# Patient Record
Sex: Female | Born: 1950
Health system: Southern US, Community
[De-identification: ages and names within clinical notes are randomized; demographics above are authoritative.]

## PROBLEM LIST (undated history)

## (undated) DIAGNOSIS — I10 Essential (primary) hypertension: Secondary | ICD-10-CM

## (undated) DIAGNOSIS — M1990 Unspecified osteoarthritis, unspecified site: Secondary | ICD-10-CM

## (undated) DIAGNOSIS — G459 Transient cerebral ischemic attack, unspecified: Secondary | ICD-10-CM

## (undated) DIAGNOSIS — E785 Hyperlipidemia, unspecified: Secondary | ICD-10-CM

## (undated) DIAGNOSIS — M65839 Other synovitis and tenosynovitis, unspecified forearm: Secondary | ICD-10-CM

## (undated) DIAGNOSIS — Z86718 Personal history of other venous thrombosis and embolism: Secondary | ICD-10-CM

## (undated) DIAGNOSIS — K219 Gastro-esophageal reflux disease without esophagitis: Secondary | ICD-10-CM

## (undated) DIAGNOSIS — R2230 Localized swelling, mass and lump, unspecified upper limb: Secondary | ICD-10-CM

## (undated) DIAGNOSIS — F419 Anxiety disorder, unspecified: Secondary | ICD-10-CM

## (undated) HISTORY — PX: KNEE ARTHROSCOPY: SUR90

## (undated) HISTORY — PX: ABDOMINAL HYSTERECTOMY: SHX81

## (undated) HISTORY — PX: FOOT ARTHRODESIS, TRIPLE: SUR54

## (undated) HISTORY — PX: TENDON RECONSTRUCTION: SHX2487

## (undated) HISTORY — PX: FOOT SURGERY: SHX648

## (undated) HISTORY — DX: Hyperlipidemia, unspecified: E78.5

## (undated) HISTORY — PX: CHOLECYSTECTOMY: SHX55

---

## 1997-09-21 ENCOUNTER — Encounter: Admission: RE | Admit: 1997-09-21 | Discharge: 1997-12-20 | Payer: Self-pay | Admitting: Orthopedic Surgery

## 1997-12-28 ENCOUNTER — Ambulatory Visit (HOSPITAL_COMMUNITY): Admission: RE | Admit: 1997-12-28 | Discharge: 1997-12-28 | Payer: Self-pay | Admitting: Urology

## 1998-05-04 ENCOUNTER — Ambulatory Visit (HOSPITAL_COMMUNITY): Admission: RE | Admit: 1998-05-04 | Discharge: 1998-05-04 | Payer: Self-pay | Admitting: Orthopedic Surgery

## 1998-07-19 ENCOUNTER — Ambulatory Visit (HOSPITAL_BASED_OUTPATIENT_CLINIC_OR_DEPARTMENT_OTHER): Admission: RE | Admit: 1998-07-19 | Discharge: 1998-07-19 | Payer: Self-pay | Admitting: Orthopedic Surgery

## 1998-07-26 ENCOUNTER — Ambulatory Visit (HOSPITAL_COMMUNITY): Admission: RE | Admit: 1998-07-26 | Discharge: 1998-07-26 | Payer: Self-pay | Admitting: Orthopedic Surgery

## 1999-01-17 ENCOUNTER — Ambulatory Visit (HOSPITAL_BASED_OUTPATIENT_CLINIC_OR_DEPARTMENT_OTHER): Admission: RE | Admit: 1999-01-17 | Discharge: 1999-01-17 | Payer: Self-pay | Admitting: Orthopedic Surgery

## 1999-06-06 ENCOUNTER — Ambulatory Visit (HOSPITAL_BASED_OUTPATIENT_CLINIC_OR_DEPARTMENT_OTHER): Admission: RE | Admit: 1999-06-06 | Discharge: 1999-06-06 | Payer: Self-pay | Admitting: Orthopedic Surgery

## 1999-09-04 ENCOUNTER — Encounter: Payer: Self-pay | Admitting: Orthopedic Surgery

## 1999-09-09 ENCOUNTER — Encounter: Payer: Self-pay | Admitting: Orthopedic Surgery

## 1999-09-09 ENCOUNTER — Inpatient Hospital Stay (HOSPITAL_COMMUNITY): Admission: RE | Admit: 1999-09-09 | Discharge: 1999-09-13 | Payer: Self-pay | Admitting: Orthopedic Surgery

## 1999-09-09 HISTORY — PX: TOTAL KNEE ARTHROPLASTY: SHX125

## 1999-09-12 ENCOUNTER — Encounter: Payer: Self-pay | Admitting: Orthopedic Surgery

## 1999-12-26 ENCOUNTER — Other Ambulatory Visit: Admission: RE | Admit: 1999-12-26 | Discharge: 1999-12-26 | Payer: Self-pay | Admitting: Gynecology

## 2000-01-27 ENCOUNTER — Ambulatory Visit (HOSPITAL_BASED_OUTPATIENT_CLINIC_OR_DEPARTMENT_OTHER): Admission: RE | Admit: 2000-01-27 | Discharge: 2000-01-27 | Payer: Self-pay | Admitting: Orthopedic Surgery

## 2000-01-27 HISTORY — PX: EXAMINATION UNDER ANESTHESIA: SHX1540

## 2000-01-27 HISTORY — PX: CLOSED MANIPULATION KNEE WITH STERIOD INJECTION: SHX5610

## 2000-04-07 ENCOUNTER — Inpatient Hospital Stay (HOSPITAL_COMMUNITY): Admission: RE | Admit: 2000-04-07 | Discharge: 2000-04-08 | Payer: Self-pay | Admitting: Urology

## 2000-04-07 HISTORY — PX: ANTERIOR AND POSTERIOR VAGINAL REPAIR: SUR5

## 2000-04-07 HISTORY — PX: PUBOVAGINAL SLING: SHX1035

## 2000-10-20 ENCOUNTER — Inpatient Hospital Stay (HOSPITAL_COMMUNITY): Admission: RE | Admit: 2000-10-20 | Discharge: 2000-10-21 | Payer: Self-pay | Admitting: Neurosurgery

## 2000-10-20 ENCOUNTER — Encounter: Payer: Self-pay | Admitting: Neurosurgery

## 2000-10-20 HISTORY — PX: ANTERIOR CERVICAL DECOMP/DISCECTOMY FUSION: SHX1161

## 2000-11-06 ENCOUNTER — Encounter: Payer: Self-pay | Admitting: Neurosurgery

## 2000-11-06 ENCOUNTER — Encounter: Admission: RE | Admit: 2000-11-06 | Discharge: 2000-11-06 | Payer: Self-pay | Admitting: Neurosurgery

## 2000-12-07 ENCOUNTER — Encounter: Payer: Self-pay | Admitting: Neurosurgery

## 2000-12-07 ENCOUNTER — Encounter: Admission: RE | Admit: 2000-12-07 | Discharge: 2000-12-07 | Payer: Self-pay | Admitting: Neurosurgery

## 2000-12-19 ENCOUNTER — Encounter: Payer: Self-pay | Admitting: Neurosurgery

## 2000-12-19 ENCOUNTER — Ambulatory Visit (HOSPITAL_COMMUNITY): Admission: RE | Admit: 2000-12-19 | Discharge: 2000-12-19 | Payer: Self-pay | Admitting: Neurosurgery

## 2001-01-29 ENCOUNTER — Other Ambulatory Visit: Admission: RE | Admit: 2001-01-29 | Discharge: 2001-01-29 | Payer: Self-pay | Admitting: Obstetrics and Gynecology

## 2002-02-10 ENCOUNTER — Ambulatory Visit (HOSPITAL_BASED_OUTPATIENT_CLINIC_OR_DEPARTMENT_OTHER): Admission: RE | Admit: 2002-02-10 | Discharge: 2002-02-10 | Payer: Self-pay | Admitting: Orthopedic Surgery

## 2002-02-10 HISTORY — PX: FOOT GANGLION EXCISION: SHX1660

## 2002-02-25 ENCOUNTER — Other Ambulatory Visit: Admission: RE | Admit: 2002-02-25 | Discharge: 2002-02-25 | Payer: Self-pay | Admitting: Obstetrics and Gynecology

## 2002-09-07 ENCOUNTER — Encounter: Payer: Self-pay | Admitting: Obstetrics and Gynecology

## 2002-09-07 ENCOUNTER — Encounter: Admission: RE | Admit: 2002-09-07 | Discharge: 2002-09-07 | Payer: Self-pay | Admitting: Obstetrics and Gynecology

## 2003-04-12 ENCOUNTER — Ambulatory Visit (HOSPITAL_BASED_OUTPATIENT_CLINIC_OR_DEPARTMENT_OTHER): Admission: RE | Admit: 2003-04-12 | Discharge: 2003-04-12 | Payer: Self-pay | Admitting: Orthopedic Surgery

## 2003-04-12 ENCOUNTER — Ambulatory Visit (HOSPITAL_COMMUNITY): Admission: RE | Admit: 2003-04-12 | Discharge: 2003-04-12 | Payer: Self-pay | Admitting: Orthopedic Surgery

## 2003-04-12 HISTORY — PX: CARPAL TUNNEL RELEASE: SHX101

## 2003-05-25 ENCOUNTER — Ambulatory Visit (HOSPITAL_BASED_OUTPATIENT_CLINIC_OR_DEPARTMENT_OTHER): Admission: RE | Admit: 2003-05-25 | Discharge: 2003-05-25 | Payer: Self-pay | Admitting: Orthopedic Surgery

## 2003-05-25 HISTORY — PX: CARPAL TUNNEL RELEASE: SHX101

## 2003-06-19 ENCOUNTER — Ambulatory Visit (HOSPITAL_COMMUNITY): Admission: RE | Admit: 2003-06-19 | Discharge: 2003-06-19 | Payer: Self-pay | Admitting: Orthopedic Surgery

## 2003-09-07 ENCOUNTER — Ambulatory Visit (HOSPITAL_COMMUNITY): Admission: RE | Admit: 2003-09-07 | Discharge: 2003-09-07 | Payer: Self-pay | Admitting: Orthopedic Surgery

## 2003-09-12 ENCOUNTER — Ambulatory Visit (HOSPITAL_COMMUNITY): Admission: RE | Admit: 2003-09-12 | Discharge: 2003-09-12 | Payer: Self-pay | Admitting: Orthopedic Surgery

## 2005-09-04 ENCOUNTER — Other Ambulatory Visit: Admission: RE | Admit: 2005-09-04 | Discharge: 2005-09-04 | Payer: Self-pay | Admitting: Obstetrics and Gynecology

## 2008-10-22 ENCOUNTER — Encounter: Admission: RE | Admit: 2008-10-22 | Discharge: 2008-10-22 | Payer: Self-pay | Admitting: Sports Medicine

## 2008-10-30 ENCOUNTER — Encounter: Admission: RE | Admit: 2008-10-30 | Discharge: 2008-10-30 | Payer: Self-pay | Admitting: Sports Medicine

## 2008-11-13 ENCOUNTER — Encounter: Admission: RE | Admit: 2008-11-13 | Discharge: 2008-11-13 | Payer: Self-pay | Admitting: Sports Medicine

## 2008-11-30 ENCOUNTER — Encounter: Admission: RE | Admit: 2008-11-30 | Discharge: 2008-11-30 | Payer: Self-pay | Admitting: Sports Medicine

## 2009-06-24 ENCOUNTER — Encounter: Admission: RE | Admit: 2009-06-24 | Discharge: 2009-06-24 | Payer: Self-pay | Admitting: Neurosurgery

## 2010-03-23 DIAGNOSIS — G459 Transient cerebral ischemic attack, unspecified: Secondary | ICD-10-CM | POA: Insufficient documentation

## 2010-03-23 HISTORY — DX: Transient cerebral ischemic attack, unspecified: G45.9

## 2010-03-31 ENCOUNTER — Encounter: Payer: Self-pay | Admitting: Emergency Medicine

## 2010-04-01 ENCOUNTER — Inpatient Hospital Stay (HOSPITAL_COMMUNITY): Admission: EM | Admit: 2010-04-01 | Discharge: 2010-04-02 | Payer: Self-pay | Admitting: Internal Medicine

## 2010-04-02 ENCOUNTER — Encounter (INDEPENDENT_AMBULATORY_CARE_PROVIDER_SITE_OTHER): Payer: Self-pay | Admitting: Internal Medicine

## 2010-09-04 LAB — DIFFERENTIAL
Basophils Absolute: 0 10*3/uL (ref 0.0–0.1)
Basophils Relative: 0 % (ref 0–1)
Eosinophils Absolute: 0.1 10*3/uL (ref 0.0–0.7)
Eosinophils Relative: 2 % (ref 0–5)
Lymphocytes Relative: 24 % (ref 12–46)
Lymphs Abs: 2.2 10*3/uL (ref 0.7–4.0)
Monocytes Absolute: 0.6 10*3/uL (ref 0.1–1.0)
Monocytes Relative: 6 % (ref 3–12)
Neutro Abs: 6.3 10*3/uL (ref 1.7–7.7)
Neutrophils Relative %: 69 % (ref 43–77)

## 2010-09-04 LAB — CBC
HCT: 40.1 % (ref 36.0–46.0)
Hemoglobin: 13.3 g/dL (ref 12.0–15.0)
MCH: 27.9 pg (ref 26.0–34.0)
MCHC: 33.3 g/dL (ref 30.0–36.0)
MCV: 83.8 fL (ref 78.0–100.0)
Platelets: 242 10*3/uL (ref 150–400)
RBC: 4.78 MIL/uL (ref 3.87–5.11)
RDW: 12.9 % (ref 11.5–15.5)
WBC: 9.1 10*3/uL (ref 4.0–10.5)

## 2010-09-04 LAB — POCT I-STAT, CHEM 8
BUN: 20 mg/dL (ref 6–23)
Calcium, Ion: 1.1 mmol/L — ABNORMAL LOW (ref 1.12–1.32)
Chloride: 103 mEq/L (ref 96–112)
Creatinine, Ser: 1 mg/dL (ref 0.4–1.2)
Glucose, Bld: 98 mg/dL (ref 70–99)
HCT: 42 % (ref 36.0–46.0)
Hemoglobin: 14.3 g/dL (ref 12.0–15.0)
Potassium: 3.7 mEq/L (ref 3.5–5.1)
Sodium: 140 mEq/L (ref 135–145)
TCO2: 30 mmol/L (ref 0–100)

## 2010-09-04 LAB — URINALYSIS, ROUTINE W REFLEX MICROSCOPIC
Bilirubin Urine: NEGATIVE
Glucose, UA: NEGATIVE mg/dL
Hgb urine dipstick: NEGATIVE
Ketones, ur: NEGATIVE mg/dL
Nitrite: NEGATIVE
Protein, ur: NEGATIVE mg/dL
Specific Gravity, Urine: 1.019 (ref 1.005–1.030)
Urobilinogen, UA: 0.2 mg/dL (ref 0.0–1.0)
pH: 6.5 (ref 5.0–8.0)

## 2010-09-04 LAB — APTT: aPTT: 29 seconds (ref 24–37)

## 2010-09-04 LAB — PROTIME-INR
INR: 0.98 (ref 0.00–1.49)
Prothrombin Time: 13.2 seconds (ref 11.6–15.2)

## 2010-09-05 LAB — LIPID PANEL
Cholesterol: 151 mg/dL (ref 0–200)
HDL: 40 mg/dL (ref >39)
LDL Cholesterol: 81 mg/dL (ref 0–99)
Total CHOL/HDL Ratio: 3.8 RATIO
Triglycerides: 152 mg/dL — ABNORMAL HIGH (ref <150)
VLDL: 30 mg/dL (ref 0–40)

## 2010-09-05 LAB — HEMOGLOBIN A1C
Hgb A1c MFr Bld: 6.7 % — ABNORMAL HIGH (ref <5.7)
Mean Plasma Glucose: 146 mg/dL — ABNORMAL HIGH (ref <117)

## 2010-09-05 LAB — COMPREHENSIVE METABOLIC PANEL
ALT: 22 U/L (ref 0–35)
AST: 23 U/L (ref 0–37)
Albumin: 3.8 g/dL (ref 3.5–5.2)
Alkaline Phosphatase: 83 U/L (ref 39–117)
BUN: 13 mg/dL (ref 6–23)
CO2: 27 mEq/L (ref 19–32)
Calcium: 9 mg/dL (ref 8.4–10.5)
Chloride: 106 mEq/L (ref 96–112)
Creatinine, Ser: 0.96 mg/dL (ref 0.4–1.2)
GFR calc Af Amer: >60 mL/min (ref >60)
GFR calc non Af Amer: 59 mL/min — ABNORMAL LOW (ref >60)
Glucose, Bld: 102 mg/dL — ABNORMAL HIGH (ref 70–99)
Potassium: 3.8 mEq/L (ref 3.5–5.1)
Sodium: 141 mEq/L (ref 135–145)
Total Bilirubin: 0.5 mg/dL (ref 0.3–1.2)
Total Protein: 6.9 g/dL (ref 6.0–8.3)

## 2010-09-05 LAB — CARDIAC PANEL(CRET KIN+CKTOT+MB+TROPI)
CK, MB: 1.6 ng/mL (ref 0.3–4.0)
CK, MB: 1.8 ng/mL (ref 0.3–4.0)
CK, MB: 2 ng/mL (ref 0.3–4.0)
Relative Index: 1.4 (ref 0.0–2.5)
Relative Index: 1.7 (ref 0.0–2.5)
Relative Index: 1.9 (ref 0.0–2.5)
Total CK: 107 U/L (ref 7–177)
Total CK: 109 U/L (ref 7–177)
Total CK: 113 U/L (ref 7–177)
Troponin I: 0.01 ng/mL (ref 0.00–0.06)
Troponin I: 0.01 ng/mL (ref 0.00–0.06)
Troponin I: <0.01 ng/mL (ref 0.00–0.06)

## 2010-09-05 LAB — CBC
HCT: 40.6 % (ref 36.0–46.0)
Hemoglobin: 13.3 g/dL (ref 12.0–15.0)
MCH: 27.3 pg (ref 26.0–34.0)
MCHC: 32.8 g/dL (ref 30.0–36.0)
MCV: 83.4 fL (ref 78.0–100.0)
Platelets: 198 10*3/uL (ref 150–400)
RBC: 4.87 MIL/uL (ref 3.87–5.11)
RDW: 13.2 % (ref 11.5–15.5)
WBC: 7.3 10*3/uL (ref 4.0–10.5)

## 2010-09-05 LAB — DIFFERENTIAL
Basophils Absolute: 0 10*3/uL (ref 0.0–0.1)
Basophils Relative: 0 % (ref 0–1)
Eosinophils Absolute: 0.1 10*3/uL (ref 0.0–0.7)
Eosinophils Relative: 2 % (ref 0–5)
Lymphocytes Relative: 24 % (ref 12–46)
Lymphs Abs: 1.7 10*3/uL (ref 0.7–4.0)
Monocytes Absolute: 0.7 10*3/uL (ref 0.1–1.0)
Monocytes Relative: 9 % (ref 3–12)
Neutro Abs: 4.8 10*3/uL (ref 1.7–7.7)
Neutrophils Relative %: 65 % (ref 43–77)

## 2010-11-08 NOTE — Discharge Summary (Signed)
[place]. [facility]  Patient:    Tonya Sanchez, Tonya Sanchez                         MRN: [id number] Adm. Date:  09/09/99 Disc. Date: 09/13/99 Attending:  Loreta Ave, M.D.                           Discharge Summary  ADMISSION DIAGNOSIS:  Advanced degenerative joint disease upper left knee.  DISCHARGE DIAGNOSIS:  Advanced degenerative joint disease upper left knee.  PROCEDURE:  Left total knee replacement.  HISTORY OF PRESENT ILLNESS:  This patient has been having longstanding difficulty with her left knee.  She had been previously arthroscoped noting grade 4 DJD medially.  She has continued with chronic persistent left medial joint pain and has failed conservative treatment.  She is now indicated for left unicompartmental versus total knee replacement.  HOSPITAL COURSE:  A 60 year old white female admitted to the hospital on 09/09/99 after appropriate laboratory studies were obtained as well as 1 g of vancomycin IV on call.  She was taken to the operating room where she underwent a left total knee replacement.  At the time of surgery, it was noted that she had also changes on the lateral compartment and it was felt that unicompartmental knee was not indicated.  Therefore, a full total knee replacement was performed.  She tolerated the procedure well.  Epidural was placed postoperatively for pain control.  Heparin 5000 units subcu q.12h. was begun until Coumadin became therapeutic.  PT, OT, and rehab consults were ordered.  Physical therapy for weightbearing as tolerated on the left with a walker.  CPM 0 to 30 degrees six to eight hours per day incrementing by 10 degrees per day.  She did not tolerate her Percocet which caused itching and therefore was changed to Vicodin or Lortab 7.5/500 one p.o. q.4h. p.r.n. pain.  Her Foley was discontinued on 3/21 as well as having a dressing change. Epidural was also discontinued on 3/21.  She did develop a temperature  and this was evaluated with CBC and differential, chest x-ray, UA, C&S as well as Doppler to rule out DVT.  Left femur and hip were also x-rayed because of proximal thigh pain.  These were all essentially negative.  Her temperature improved and it was felt that she was ambulatory and stable and was discharged on 3/23 in improved condition.  She will return in follow up back to the office in 7 to 10 days for staple removal.  DOPPLER STUDIES:  No evidence of DVT.  RADIOGRAPHIC STUDIES:  On 09/12/99, chest x-ray showed no active lung disease, left total knee replacement in good position, negative left hip.  Of 09/09/99, left knee revealed anatomic alignment postop total knee replacement.  LABORATORY STUDIES:  On 09/04/99, hemoglobin 13.1, hematocrit 38.5%, white count 8100, platelets 225,000.  On 09/12/99, hemoglobin 9.6, hematocrit 28.0%, white count 8600, platelets 177,000.  Chemistries of 3/14: Sodium 139, potassium 4.1, chloride 101, CO2 of 31, glucose of 105, BUN 12, creatinine 0.8, calcium 9.3.  Total protein 7.1, albumin 3.9, AST 22, ALT 17, ALP 81, total bilirubin 0.6.  Discharge of 3/22: Sodium 135, potassium 3.6, chloride 99, CO2 of 27, glucose 137, BUN 6, creatinine 0.7, calcium 8.4.  Urinalysis of 3/14 and 3/22 were benign.  Blood type was A positive, antibody screen negative.  Urine culture of 3/22 showed 4000 colonies suggests that this is  multiple species and recollection is necessary if needing diagnostic bacteria.  DISCHARGE INSTRUCTIONS:  Given a prescription for iron sulfate 324 mg one p.o. daily with meals.  Coumadin 5 mg as directed by pharmacy.  Continue home meds. Weightbear as tolerated using the walker and immobilizer.  She may shower.  No tub baths.  Keep the wound clean and dry.  Use the CPM machine as instructed. Follow back up in the office in 10 days for follow up. DD:  10/08/99 TD:  10/08/99 Job: [id number]

## 2010-11-08 NOTE — Op Note (Signed)
[place]. [facility]  Patient:    Tonya Sanchez, Tonya Sanchez                       MRN: [id number] Proc. Date: 10/20/00 Adm. Date:  [id number] Attending:  Josie Saunders                           Operative Report  PREOPERATIVE DIAGNOSIS:  Herniated cervical disc with cervical spondylosis, degenerative disc disease and cervical radiculopathy.  POSTOPERATIVE DIAGNOSIS:  Herniated cervical disc with cervical spondylosis, degenerative disc disease and cervical radiculopathy.  OPERATION:  Anterior cervical diskectomy and fusion C5-6 with Allograft bone graft and anterior cervical plate.  SURGEON:  Danae Orleans. Venetia Maxon, M.D.  ASSISTANT:  Hewitt Shorts, M.D.  ANESTHESIA:  General endotracheal  ESTIMATED BLOOD LOSS:  Minimal  COMPLICATIONS:  None.  DISPOSITION:  Recovery.  INDICATIONS: [name] is a 60 year old disabled woman with significant right greater than left upper extremity pain and weakness in a C6 distribution. She has marked spondylitic disease at the C5-6 level.  It was elected to take her to surgery for anterior cervical diskectomy and fusion.  DESCRIPTION OF PROCEDURE:  [name] was brought to the operating room. Following satisfactory and uncomplicated induction of general endotracheal anesthesia and placement of intravenous line, she was placed in the supine position on the operating table.  Her neck was placed in slight extension. She was placed in 10 pounds of Holter traction.  Her anterior neck was then prepped and draped in the usual sterile fashion.  The planned incision was infiltrated with 0.25% Marcaine and 0.5% Lidocaine with 1:200,000 epinephrine. Incision was made from the midline to the anterior border of the sternocleidomastoid muscle on the left side of her neck.  This was carried sharply through platysmal layer and subplatysmal dissection was performed exposing the anterior border of the sternocleidomastoid muscle.   Blunt dissection was performed keeping the carotid sheath lateral, trachea and esophagus medial, twisting the anterior cervical spine.  A fine needle was placed relative to the C5-6 level and intraoperative x-ray confirmed this to be the C5-6 level.  Subsequently the longus colli muscles were taken down from the anterior cervical spine bilaterally from C5 to C6 with electrocautery and Key elevator.  Self retaining retractor was placed to facilitate exposure of the disc space.  The C5-6 level was then incised with #15 blade.  Disc material was removed in a piece meal fashion.  The end plates of the degenerated disc were then scraped of residual disc material.  Disc space spreader was placed.  The microscope was brought into the field.  Under microscopic visualization using the Macro Max drill and O8M bur the end plates of C5 and C6 were decorticated and the large uncinate and end plate spurs of C5 and C6 were drilled down and then removed with 2 mm gold tip Kerrison rongeur.  The posterior longitudinal ligament was incised with arachnoid knife and removed in a piece meal fashion.  Both of the take offs of the C5-6 and C6 nerve roots were identified and decompressed as they extended out the neuroforamina.  Particular care was paid to the right side and the nerve root was widely decompressed.  Hemostasis was assured with Gelfoam soaked in thrombin.  An 8 mm Allograft tricortical bone graft was then cut to the appropriate depth inserted in the interspace and counter sunk appropriately. The ventral spine was then prepared  for plating and a 16 mm Tether anterior cervical plate was affixed to the anterior cervical spine with two 13 x 4 mm variable angle screws at C5 and two similarly sized screws at C6. All screws had excellent purchase.  Locking mechanisms were engaged.  Wound was copiously irrigated with Bacitracin and saline.  Final x-ray was obtained to confirm position of bone graft and the  anterior cervical plate.  The wound was then irrigated. Hemostasis was assured.  Platysmal layer was closed with interrupted 3-0 Vicryl stitch and the skin edges were reapproximated with running 4-0 Vicryl subcuticular stitch.  The wound was dressed with Benzoin and Steri-Strips, Telfa gauze and tape.  The patient was extubated in the operating room and taken recovery room in stable and satisfactory condition having tolerated the operation well. Counts were correct at the end of the case. DD:  10/20/00 TD:  10/20/00 Job: [id number]

## 2010-11-08 NOTE — Op Note (Signed)
[place]. [facility]  Patient:    Tonya Sanchez, Tonya Sanchez                       MRN: [id number] Proc. Date: 09/09/99 Adm. Date:  [id number] Attending:  Colbert Ewing                           Operative Report  PREOPERATIVE DIAGNOSIS:  End-stage degenerative joint disease left knee, primarily medial compartment.  POSTOPERATIVE DIAGNOSIS:  End-stage degenerative joint disease left knee, primarily medial compartment with involvement of all three compartments.  PROCEDURE:  Left total knee replacement utilizing Osteonics prosthesis. Press-fit #7 femoral component, cemented #7 tibial component with 10 mm polyethylene insert, cruciate-retaining type, cemented/recessed, nonmetal-backed 26 mm patella component.  Lateral retinacular release.  SURGEON:  Loreta Ave, M.D.  ASSISTANT:  Arlys John D. Petrarca, P.A.-C.  ANESTHESIA:  General.  BLOOD LOSS:  Minimal.  TOURNIQUET TIME:  One hour and 10 minutes.  SPECIMENS:  Excised bone and soft tissue.  CULTURES:  None.  COMPLICATIONS:  None.  DRESSING:  Soft compressive with knee immobilizer.  DRAINS:  Hemovac x 2.  DESCRIPTION OF PROCEDURE:  Patient brought to the operating room and after adequate anesthesia had been obtained, left leg was prepped and draped in usual sterile fashion after a tourniquet was applied at the upper aspect of the thigh. Exsanguinated with elevation and Esmarch.  Tourniquet inflated to 350 mmHg. Knee examined with slight flexion contracture, mild varus alignment, stable ligaments, flexion to 100 degrees.  Straight incision above the patella down to the tibial  tubercle.  Started out relatively small in hopes of doing a unicompartmental replacement.  Medial parapatellar arthrotomy.  Knee was opened and inspected with grade 4 changes medially, but also areas of grade 4 changes laterally and lesser extent patellofemoral joint.  Given the changes in the lateral  compartment, unicompartmental replacement not deemed viable.  Incision opened sufficiently to allow access for knee replacement utilizing complete knee replacement.  The patella was everted, the knee was exposed.  Remnants of menisci and anterior cruciate ligament and excessive fat pad removed.  Distal femur exposed and spurs removed. Intramedullary guide placed.  Distal cut set at 5 degrees of valgus removing 10 mm.  Sized to a #7 component.  Jigs put in place, definitive cuts made for cruciate-retaining prosthesis.  Trial put in place, found to fit well.  Trial removed.  Posterior cruciate ligament and popliteal tendon retained throughout.  Proximal end exposed.  Tibial spines resected with a saw.  Intramedullary guide  placed.  Proximal cut removing 4 mm from the most deficient medial side.  Five degree posterior slope cut.  Trials put in place with a #7 component on the tibia, which fit well, as well as, a #7 component on the femur.  With a 10 mm insert I had full extension, full flexion, nicely balanced knee set at 5 degrees of valgus.  Tibia was marked for rotation.  This was then hand reamed with the appropriate and reamers for a #7 component.  Trials removed.  The patella was then incised, reamed and drilled for a 26 mm patella.  All trials put in place.  Persistent lateral tracking patellofemoral joint.  Lateral release therefore performed with electrocautery from inside out yielding good tracking.  All trials removed. Copious irrigation with the pulse irrigating device.  Cement prepared, placed on the tibial and patella components, which were seated  appropriately with excessive cement removed.  Polyethylene attached to the tibia.  Femoral component hammered in place.  Once the cement had dried, the knee was examined.  Full extension, full  flexion, 5 degrees of valgus alignment.  Good patellofemoral tracking, nicely balanced knee.  Wound irrigated.  Hemovacs were  placed and brought out through separate stab wounds.  Arthrotomy closed with #1 Vicryl.  Skin and subcutaneous  tissue with Vicryl and staples.  Margins of the wound and the knee injected with Marcaine.  Sterile compressive dressing applied. Dressings taken and placed over the knee after drapes were taken down.  Knee immobilizer applied after tourniquet removed.  Anesthesia was then to proceed with placement of an epidural catheter  for postoperative analgesia.  Once completed, anesthesia reversed, brought to recovery room.  Tolerated surgery well, no complications. DD:  09/11/99 TD:  09/11/99 Job: [id number]

## 2010-11-08 NOTE — H&P (Signed)
[facility]  Patient:    Tonya Sanchez, Tonya Sanchez                       MRN: [id number] Adm. Date:  [id number] Disc. Date: [id number] Attending:  Colbert Ewing                         History and Physical  CHIEF COMPLAINT:  Recurrent urinary incontinence and pelvic prolapse.  HISTORY OF PRESENT ILLNESS:  The patient is a 60 year old gravida 5, para 4-0-1-4 Caucasian female, was placed total abdominal hysterectomy for uterine fibroids in 1975 and status post Stamey procedure with anterior colporrhaphy in 1988, who presented to the office with the complaint of recurrent urinary incontinence.  The patient reports that she had recurrence of her urinary incontinence shortly following her Stamey procedure.  The patient reported leakage of urine with coughing and sneezing along with urge incontinence which required pad use.  The patient also reported leakage of urine during sexual activity, which was very bothersome to her.  The patient denies any history of fecal incontinence, but reported the need for occasional splinting in order to have a bowel movement.  The patient requested surgical treatment for both the urinary incontinence and for the pelvic floor relaxation and difficulty with bowel movements.  The patients urologic history is significant for multiple prior cystoscopies with biopsies for benign bladder tumors.  The patient has been followed closely by her urologist, Dr. Annabell Howells.  PAST OBSTETRIC AND GYNECOLOGIC HISTORY: 1. Status post total abdominal hysterectomy for benign fibroids in 1975. 2. Status post normal spontaneous vaginal delivery x 4. 3. Pap smear on December 26, 1999 within normal limits. 4. Mammogram on January 07, 2000 demonstrated scattered benign calcifications. 5. The patient is receiving hormone replacement therapy with Climera patches    0.75 mg every week.  PAST MEDICAL HISTORY: 1. Bells palsy diagnosed in 25. 2. Arthritis. 3.  Colitis. 4. Deep venous thrombosis following cholecystectomy. 5. Disk herniation in the lower back. 6. History of blood transfusion.  PAST SURGICAL HISTORY: 1. Status post D&C in 1971. 2. Status post total abdominal hysterectomy in 1975. 3. Status post cholecystectomy in 1976. 4. Status post Stamey procedure with anterior colporrhaphy in 1988. 5. Status post multiple surgeries on bilateral hips, knees and feet. 6. Status post right thumb release. 7. Status post multiple cystoscopies with biopsies for benign bladder tumors.  MEDICATIONS:  Climera patch 0.75 mg to the skin every week.  ALLERGIES:  PERCOCET produces ITCHING.  KEFLEX results in YEAST INFECTIONS.  SOCIAL HISTORY:  The patient is married.  She denies the use of tobacco, alcohol or illicit drugs.  FAMILY HISTORY:  The patients mother developed breast cancer at the age of 19.  Grandfather with cancer of the nose, and a grandmother with colon cancer.  REVIEW OF SYSTEMS:  Please refer to the history of present illness.  PHYSICAL EXAMINATION:  VITAL SIGNS:  Blood pressure 130/70.  GENERAL:  Pleasant, middle-aged female in no acute distress.  HEENT:  Normocephalic and atraumatic.  NECK:  Negative for adenopathy or thyromegaly.  LUNGS:  Clear to auscultation bilaterally.  HEART:  Regular rate and rhythm.  No evidence of a murmur.  ABDOMEN:  Right upper quadrant oblique incision, a vertical midline incision, a transverse suprapubic incision, all without evidence of herniation.  THe abdomen is soft, nontender and without evidence of hepatosplenomegaly or organomegaly.  PELVIC:  Normal external genitalia.  The urethra  is noted to be within normal limits.  The urethra shows hypermobility and, on Q-tip test, deflects from +3 degrees to +60 degrees.  There is evidence of a second degree cystoceles, first degree apical prolapse and a second degree rectocele.  The cervix is surgically absent.  There is no evidence of any  midline or adnexal masses. Rectovaginal examination confirms the above.  EXTREMITIES:  Examination of the lower extremities is consistent with multiple surgical incisions.  ASSESSMENT:  A 60 year old gravida 5, para 4, failed 1 female, status post total abdominal hysterectomy and status post Stamey procedure and anterior colporrhaphy who has both evidence of recurrent genuine stress incontinence and recurrent pelvic organ prolapse.  The patient has decided for surgical evaluation and treatment of the above.  In addition, the patient does have a history of benign bladder tumors and has been followed closely by her urologist.  The patient does have a history of a postoperative deep venous thrombosis.  PROCEDURE:  The patient will undergo an anterior and posterior colporrhaphy with a possible iliococcygeal sagittal vault suspension on April 07, 2000 at [facility].  At the same time, the patient will undergo a pubourethral sling and cystoscopy under the direction of Dr. Annabell Howells.  The rnb have been discussed with the patient, who wishes to proceed.  The patient will continue on her Climera patch.  The patient will be treated with TED hose stockings and either venous compression stockings or subcutaneous heparin with mini dose of subcutaneous heparin b.i.d. for DVT prophylaxis. DD:  04/06/00 TD:  04/06/00 Job: [id number]

## 2010-11-08 NOTE — Procedures (Signed)
[place]. [facility]  Patient:    Tonya Sanchez, Tonya Sanchez                       MRN: [id number] Adm. Date:  [id number] Attending:  Colbert Ewing                           Procedure Report  PROCEDURE PERFORMED:  Epidural placement.  INDICATION FOR PROCEDURE:  [name] is a 60 year old white female who  presented to the operating room for a total knee arthroplasty.  The patient discussed postoperative pain control options with me and her surgeon and elected to take a postoperative epidural catheter for her pain management.  The patient understood the risks and benefits of this procedure and understood that the catheter would be placed while she was under general anesthesia following the total knee arthroplasty.  DESCRIPTION OF PROCEDURE:  Following her surgery, the patient was placed in the  right lateral decubitus position.  The lumbar spine was sterilely prepped and draped.  A 70 gauge Tuohy needle was advanced to a loss of resistance technique at the L2-L3 interspace.  The epidural needle did not aspirate blood or CSF and the catheter was inserted approximately 5 cm into the epidural space.  The needle was removed and the catheter was secured to the patients back with tape.  Catheter id not aspirate blood or CSF and a 5 cc test dose of 1% Lidocaine was administered to the patient.  The patient tolerated this well and 3 cc of Fentanyl diluted with  normal saline in 5 cc of 1/4% Marcaine were administered to the patient in an incremental fashion.  The patient was then placed in the supine position, extubated and brought to the [facility] where she was placed on a Marcaine and Fentanyl pump. The patient tolerated the procedure well. DD:  09/09/99 TD:  09/09/99 Job: [id number]

## 2010-11-08 NOTE — Op Note (Signed)
[place]. [facility]  Patient:    Tonya, Sanchez                       MRN: [id number] Proc. Date: 01/27/00 Adm. Date:  [id number] Attending:  Colbert Ewing                           Operative Report  PREOPERATIVE DIAGNOSES: 1. Arthrofibrosis, left knee status post total knee replacement. 2. Synovitis with degenerative joint disease, left ankle.  POSTOPERATIVE DIAGNOSIS: 1. Arthrofibrosis, left knee status post total knee replacement. 2. Synovitis with degenerative joint disease, left ankle.  OPERATION: 1. Left knee exam and manipulation under anesthesia to break up adhesions.    Intra-articular injection of morphine, Marcaine and Aristospan. 2. Left ankle exam under anesthesia with intra-articular injection of    Aristospan and Marcaine.  SURGEON:  Loreta Ave, M.D.  ANESTHESIA:  General.  ESTIMATED BLOOD LOSS:  None.  SPECIMENS:  None.  CULTURES:  None.  COMPLICATIONS:  None.  DRESSING:  Soft compressive.  DESCRIPTION OF PROCEDURE:  The patient was brought to the operating room and after adequate anesthesia had been obtained, attention was turned to the left ankle.  Examination with decreased motion, reactive synovitis secondary to DJD.  No instability.  Under sterile technique, the ankle was entered from an anteromedial approach and injected with Marcaine 0.25% without epinephrine and 1 cc of Aristospan.  Band-Aid applied.  Attention, turned to the left knee. Examined with 5 degree flexion contracture.  Further flexion to about 85, relatively abrupt end point.  Gently manipulated with breaking up of adhesions.  Full flexion achieved as well as full extension with improved patella femoral mobility after manipulation.  No adverse occurrence.  Under sterile technique, the knee joint itself was injected with Aristospan and 0.25% Marcaine with epinephrine as well as with 4 cc of morphine for postoperative analgesia.  Band-Aid  applied.  Anesthesia reversed.  Brought to the recovery room.  Tolerated the surgery well with no complications. DD:  01/27/00 TD:  01/27/00 Job: [id number]

## 2010-11-08 NOTE — Op Note (Signed)
NAME:  Tonya Sanchez, Tonya Sanchez                          ACCOUNT NO.:  [id number]   MEDICAL RECORD NO.:  [id number]                   PATIENT TYPE:  OUT   LOCATION:  [facility]                                 FACILITY:  [facility]   PHYSICIAN:  Loreta Ave, M.D.              DATE OF BIRTH:  06-12-1951   DATE OF PROCEDURE:  05/25/2003  DATE OF DISCHARGE:  04/12/2003                                 OPERATIVE REPORT   PREOPERATIVE DIAGNOSES:  Left carpal tunnel syndrome.   POSTOPERATIVE DIAGNOSES:  Left carpal tunnel syndrome.   OPERATION PERFORMED:  Left carpal tunnel release.   SURGEON:  Loreta Ave, M.D.   ASSISTANT:  Arlys John D. Petrarca, P.A.-C.   ANESTHESIA:  IV regional.   SPECIMENS:  None.   CULTURES:  None.   COMPLICATIONS:  None.   DRESSING:  Soft compressive with bulky hand dressing and splint.   DESCRIPTION OF PROCEDURE:  The patient was brought to the operating room and  placed on the operating table in supine position.  after adequate anesthesia  had been obtained, prepped and draped in the usual sterile fashion.  A small  incision made along the thenar eminence over the carpal tunnel extending  slightly ulnarward at the distal wrist crease.  Skin and subcutaneous tissue  were divided avoiding injury to the palmar branch of the median nerve.  Retinaculum over the carpal tunnel exposed and incised under direct  visualization from the forearm fascia proximally to the palmar arch  distally.  Once complete, median nerve was assessed.  Moderate constriction  of the nerve which improved after carpal tunnel release and epineurotomy.  Digital branches, motor branches identified, protected and decompressed.  Wound irrigated.  Nice complete decompression throughout.  Skin closed with  mattress nylon suture.  Margins of the wound injected with Marcaine.  Sterile compressive dressing with a trauma dressing and splint applied.  Anesthesia reversed.  Brought to recovery room.  Tolerated  surgery well  without complication.                                               Loreta Ave, M.D.    DFM/MEDQ  D:  05/25/2003  T:  05/26/2003  Job:  [id number]

## 2010-11-08 NOTE — Discharge Summary (Signed)
[facility]  Patient:    Tonya Sanchez, Tonya Sanchez                    MRN: [id number] Adm. Date:  04/07/00 Disc. Date: 04/08/00 Attending:  Debbe Bales A. Edward Jolly, M.D. CC:         Excell Seltzer. Annabell Howells, M.D.   Discharge Summary  ADMISSION DIAGNOSES: 1. Pelvic organ prolapse. 2. Genuine stress incontinence.  DISCHARGE DIAGNOSES: 1. Pelvic organ prolapse. 2. Genuine stress incontinence. 3. Status post anterior and posterior colporrhaphy with iliococcygeus vaginal    vault suspension and pubovaginal sling.  OPERATIONS:  Anterior and posterior colporrhaphy with iliococcygeus vaginal vault suspension performed under the direction of Dr. Conley Simmonds and pubovaginal Dermatrix pubovaginal sling under the direction of Dr. Bjorn Pippin.  HISTORY OF PRESENT ILLNESS:  The patient was a 60 year old gravida 5, para 4-0-1-4, Caucasian female, status post total abdominal hysterectomy and status post Stamey procedure and anterior colporrhaphy, who presented complaining of recurrent urinary incontinence and vaginal prolapse.  The patients urologic history was also significant for benign bladder tumors for which she had been followed with diagnostic cystoscopies with biopsy.  She had no evidence of any bladder malignancy.  The patients pelvic examination was significant for urethral hypomobility with a Q-tip test demonstrating deflecting from +3 to +60 degrees.  There was evidence of a second-degree cystocele, first-degree apical prolapse, and a second-degree rectocele.  The cervix and uterus were surgically absent.  There was no evidence of any midline or adnexal masses.  Rectovaginal exam confirmed the bimanual pelvic examination.  HOSPITAL COURSE:  The patient was admitted on April 07, 2000, at which time she underwent an anterior and posterior colporrhaphy with iliococcygeus vaginal vault suspension and pubovaginal sling with Dermatrix while under general endotracheal anesthesia.   The estimated blood loss from the surgery was 200 cc, and there were no complications.  Postoperatively, the patients hospital course was uncomplicated.  The patient had good pain control with a PCA, and she was switched to Dilaudid, which controlled her pain well.  The patient was slowly advanced to a regular diet which she tolerated without difficulty.  The patient began bladder training at the time of her discharge, and she had a good understanding of how to use the suprapubic catheter.  The patient used PAS stockings and TEDD hose for DVT prophylaxis while hospitalized, and she demonstrated good independent ambulation prior to her discharge.  The patients vaginal packing was removed on postoperative day #1, and there was no evidence of any active vaginal bleeding noted.  Her discharge hematocrit was measured at 34.5% on April 08, 2000.  The patient was discharged to home in good condition on April 08, 2000.  DISCHARGE INSTRUCTIONS:  Prescription for Macrobid and Dilaudid prescribed per Dr. Annabell Howells.  The patient will take a regular diet.  She will continue with her bladder training, and she will follow up with Dr. Annabell Howells in the office in one week.  The patient will call if she experiences any problems with active vaginal bleeding, fever greater than 100 degrees Fahrenheit, blockage of her suprapubic catheter, or pain around the suprapubic catheter site, nausea and vomiting, pain uncontrolled by her medication, or any other concern. DD:  05/18/00 TD:  05/18/00 Job: [id number]

## 2010-11-08 NOTE — H&P (Signed)
[name]  Patient:    Tonya Sanchez, Tonya Sanchez                       MRN: [id number] Adm. Date:  [id number] Attending:  Josie Saunders                         History and Physical  REASON FOR ADMISSION:  Herniated cervical disk with cervical spondylosis and degenerative disk disease with cervical radiculopathy.  HISTORY OF ILLNESS:  Tonya Sanchez is a 60 year old right-handed woman who presented at the request of Dr. Eulah Pont for neurosurgical consultation for right arm pain.  I initially saw her on October 12, 2000.  She notes that her right arm feels heavy.  She says her hand goes numb.  She says her neck stays sore and that she has a lot of headaches daily.  She denies any left upper extremity pain or weakness.  She does note weakness in her right arm.  She says that all of her fingers go occasionally numb and she has decreased feeling in her right hand.  She denies any lower extremity or any bowel or bladder dysfunction other than decreased bladder control which says is secondary to multiple bladder surgeries.  Tonya Sanchez currently grades her pain at 6-7/10 in severity and says it is quite incapacitating to her.  She underwent plain x-rays and cervical MRI which were performed at [facility].  The MRI of her cervical spine demonstrates at C5-6 level that she has moderate disk space narrowing, posterior projecting osteophytes, and bilateral uncinate spurring causing bilateral foraminal stenosis, right greater than left.  At the C4-5 level, there is mild spondylitic disease without significant foraminal stenosis.  At the C6-7 level there is minimal posterior disk bulge.  REVIEW OF SYSTEMS:  A detailed review of systems sheet was reviewed with the patient.  Pertinent positives:   EYES:  She wears glasses.  EARS, NOSE, MOUTH, AND THROAT:  She notes nasal congestion and drainage.  She had the last chest x-ray in 2001.   MUSCULOSKELETAL:  She notes back pain, joint pain, swelling, arthritis, neck pain.  NEUROLOGIC:  She notes problems with her memory and inability to concentrate.  PSYCHIATRIC:  She notes depression.  All other systems negative.  PAST MEDICAL HISTORY:  CURRENT MEDICAL CONDITIONS:  None noted.  There is a history of depression.  PRIOR OPERATIONS AND HOSPITALIZATIONS:  There are 18 enumerated procedures and dates:  D&C 1972; hysterectomy May 06, 1974; cholecystectomy April 06, 1975; Stamey procedure and cystocele repair December 12, 1986; reconstruction of the posterior tibia tendon in her left foot; triple arthrodesis and bone graft right foot and hip; removal of hardware from both feet March 05, 1992; right thumb release January 22, 1996; removal of growth from her bladder December 28, 1997; arthroscopic surgery left knee March 1999; arthroscopic surgery right knee July 19, 1998; arthroscopic surgery left knee June 06, 1999; spurs removed from both little toes January 17, 2000; left knee manipulation and injection in left knee and ankle surgery January 27, 2000; and bladder sling and A&P repair April 07, 2000.  MEDICATIONS: 1. Arthrotec 75 mg two times daily. 2. Zoloft 50 mg daily. 3. Citracal + D once daily.  ALLERGIES:  KEFLEX, which causes yeast infection; PERCOCET causes itching.  HEIGHT AND WEIGHT:  She is 5 feet 7 inches tall and 235 pounds.  FAMILY HISTORY:  Mother died age 46  with diabetes and cancer.  Father died age 4 of stroke and heart disease.  SOCIAL HISTORY:  Tonya Sanchez is a nondrinker, nonsmoker, no history of substance abuse.  She is disabled.  DIAGNOSTIC STUDIES:  As above.  PHYSICAL EXAMINATION:  GENERAL APPEARANCE:  On examination today, Tonya Sanchez is a pleasant, cooperative, middle-aged white female in obvious discomfort.  HEENT:  Normocephalic, atraumatic.  Pupils equal, round, and reactive to light.  Extraocular movements are intact.  Sclerae  white, conjunctivae pink. Oropharynx benign, uvula midline.  NECK:  She has right parascapular discomfort to palpation and mildly positive right Spurling maneuver, negative on the left.  Negative Lhermittes sign with axial compression.  She has decreased range of motion of her cervical spine and increased pain with extension of her neck.  RESPIRATORY:  There is normal respiratory effort with good intercostal function.  Lungs are clear to auscultation and no rales, rhonchi, or wheezes.  CARDIOVASCULAR:  Heart has a regular rate and rhythm to auscultation.  No murmurs are appreciated.  There is no extremity edema, clubbing, or cyanosis, palpable pedal pulses.  ABDOMEN:  Obese, soft, nontender, no hepatosplenomegaly appreciated or masses. There are active bowel sounds.  No guarding or rebound.  MUSCULOSKELETAL:  The patient is able to walk about the examining room with a normal casual heel and toe gait.  Negative shoulder impingement testing.  NEUROLOGIC:  The patient is oriented to time, person, and place and she has good recall of both recent and remote memory with normal attention span and concentration.  The patient speaks with clear and fluent speech and exhibits normal language function and appropriate fund of knowledge.  Cranial nerves examination:  Pupils are equal, round and reactive to light.  Extraocular movements are full.  Visual fields are full to confrontational testing. Facial sensation, facial motor intact and symmetric.  Hearing is intact to finger rub.  Palate is upgoing.  Shoulder shrug is symmetric.  Tongue protrudes in the midline.  Motor examination:  Motor strength is 5/5 in bilateral deltoids, triceps, hand grips, interossei; 5/5 in the left biceps, 4/5 in right biceps; 5/5 left wrist extension, 4+/5 right wrist extension. Strength in the lower extremities shows motor strength is 5/5 in hip flexion-extension, quadriceps, hamstrings, plantar flexion,  dorsiflexion, extensor hallucis longus.  Sensory examination:  She notes decreased pin sensation in a C6 distribution on the right forearm and hyperesthesia to  pinprick in the right thumb to pin evaluation; otherwise normal sensory examination.  Deep tendon reflexes are 3 in the biceps, triceps, and brachioradialis; 2 at the knees; 2 at the ankles.  Great toes are downgoing to plantar stimulation.  No Hoffman sign.  Cerebellar examination:  Normal coordination in the upper and lower extremities.  Normal rapid alternating movements.  Romberg test is negative.  IMPRESSION AND RECOMMENDATIONS:  Tonya Sanchez is a 60 year old disabled woman with significant spondylitic spur at C5-6 with foraminal stenosis and a C6 radiculopathy.  I have recommended to her that she undergo anterior cervical diskectomy and fusion at the C5-6 level with allograft bone grafting and anterior cervical plating.  I went over the diagnostic studies in detail and reviewed surgical models, and also discussed the exact nature of the surgical procedure, attendant risks, potential benefits, and typical operative and postoperative course.  I discussed the risks of surgery which include but are not limited to the risks of anesthesia; blood loss; infection; injury to various neck structures including trachea, esophagus which could either temporary or permanent swallowing difficulties  and also the potential for perforation of the esophagus which may require operative intervention, larynx; recurrent laryngeal nerve which may cause either temporary or permanent vocal cord paralysis resulting in either temporary or permanent voice changes; injury to cervical nerve roots which could cause either temporary or permanent arm pain, numbness, or weakness.  There is a small chance of injury to the spinal cord which could cause paralysis.  There is the potential for malplacement of instrumentation, fusion failure, need for repeat  surgery, degenerative diseases at other levels of the neck, failure to relieve her pain, worsening of pain.  I also discussed with the patient that she will lose some neck mobility from the surgery.  Surgery is scheduled for October 20, 2000.DD: 10/20/00 TD:  10/20/00 Job: [id number]

## 2010-11-08 NOTE — Op Note (Signed)
[facility]  Patient:    Tonya Sanchez, Tonya Sanchez                    MRN: [id number] Proc. Date: 04/07/00 Adm. Date:  [id number] Attending:  Evlyn Clines                           Operative Report  PREOPERATIVE DIAGNOSES:  Recurrent pelvic organ prolapse, genuine stress incontinence.  POSTOPERATIVE DIAGNOSES:  Recurrent pelvic organ prolapse, genuine stress incontinence.  PROCEDURE:  Anterior and posterior colporrhaphy with iliococcygeus vaginal vault suspension.  SURGEON:  Brook A. Edward Jolly, M.D.  ASSISTANTS:  Luvenia Redden, M.D., Excell Seltzer. Annabell Howells, M.D.  ANESTHESIA:  General endotracheal.  TOTAL IV FLUIDS:  2300 cc Ringers lactate.  ESTIMATED BLOOD LOSS:  200 cc.  URINE OUTPUT:  1000 cc.  COMPLICATIONS:  None.  INDICATIONS FOR PROCEDURE:  The patient is a 60 year old gravida 5, para 4-0-1-4, Caucasian female, status post total abdominal hysterectomy for uterine fibroids in 1975, and status post Stamey procedure and an anterior colporrhaphy in 1988, who presented to the office with recurrent urinary incontinence and vaginal prolapse.  The patient wished for surgical evaluation and treatment of the prolapse and incontinence.  The patient did have a history of benign bladder tumors, and she had been followed closely with cystoscopy procedures.  She had no evidence of any malignancy of the bladder.  The patient agreed to an anterior and posterior colporrhaphy with iliococcygeus vaginal vault suspension after the risks, benefits, and alternatives were reviewed with her.  In addition, she consented to having a pubovaginal sling performed by Dr. Bjorn Pippin during this same operation.  FINDINGS:  Examination under anesthesia revealed a second degree cystocele, first degree apical prolapse, and a second degree rectocele.  The uterus was surgically absent.  DESCRIPTION OF PROCEDURE:  With an IV in place, the patient was escorted to the operating room  suite after she was properly identified.  The patient was treated preoperatively with Levaquin 500 mg p.o. x 1 and heparin 5000 units subcutaneously x 1 prior to the beginning of the procedure.  The patient received general endotracheal anesthesia, and she was then placed in the dorsal lithotomy position.  The patients vagina, perineum, and lower abdomen were then sterilely prepped and draped.  The bladder was drained of any urine, and an examination under anesthesia was performed.  A through-and-through suture of 0 Vicryl was used to mark the vaginal apices bilaterally.  Allis clamps were then used to mark the midline of the vagina from the apex to approximately 1 cm below the urethra.  The subvaginal tissue was then injected with 0.5% lidocaine with 1:200,000 of epinephrine for excellent blanching of the tissue.  The vaginal tissue was then incised in a vertical fashion using a Mayo scissors.  The vaginal tissue was dissected off of the overlying vaginal mucosa using sharp dissection with the Mayo scissors. At the completion of the dissection bilaterally, Dr. Bjorn Pippin performed a pubovaginal sling procedure with Dermatrix.  Please refer to this dictation separately.  After completion of the placement of the Dermatrix, the remainder of the cystocele was reduced by placing vertical mattress sutures of 0 PDS. This provided excellent reduction of the cystocele.  At this time, Dr. Annabell Howells left the operating room and the operation was continued under my direction with the assistance of Dr. Greta Doom.  The introitus was marked with Allis clamps, which were also used  to mark the midline of the vagina up to the level of the anterior vaginal vault incision for the cystocele.  Again, the posterior vaginal wall was injected with 0.5% lidocaine with 1:200,000 of epinephrine.  The vagina was incised in a vertical fashion up to the level of the cystocele.  The perirectal fascia was dissected off of the  overlying vaginal mucosa using a Mayo scissors bilaterally.  The proposed sites of the iliococcygeus sutures were then explored on each side of the pelvis.  A single suture of 0 PDS was brought through the vaginal mucosa on the patients left-hand side, through the iliococcygeus musculature on the ipsilateral side, and then back out through the vaginal mucosa on that same side.  This suture was held until the end of the case, at which time it was tied.  The same procedure that was performed on the patients left-hand side was repeated with the iliococcygeus suture on the patients right-hand side. Again, this suture was held.  The rectocele was reduced at this point using horizontal and vertical mattress sutures of 2-0 PDS and 0 PDS.  This provided excellent reduction of the rectocele.  A crown stitch of 0 Vicryl was placed along the perineal body after a triangular-shaped piece of tissue was removed from this region using sharp dissection with a scalpel and a Metzenbaum scissors.  The excess vaginal tissue was then trimmed away, and the vagina was closed anteriorly to posteriorly with a running locked suture of 2-0 Vicryl. This suture was continued as a subcuticular suture along the perineal body in standard fashion as for an episiotomy repair.  The iliococcygeus sutures were tied, and there was excellent elevation of the vaginal apices bilaterally.  A rectal exam confirmed the absence of sutures in the rectum.  An iodoform gauze packing was then placed inside the vagina, and the patient was cleansed of any remaining Betadine.  Steri-Strips were placed over the suprapubic incision, and a sterile bandage was placed around the site of the patients suprapubic tube and suprapubic incision created by Dr. Annabell Howells.  The patient was taken out of the dorsal lithotomy position.  She was awakened and extubated. She was escorted to the recovery room in stable and awake condition.  There were no  complications to the procedure.  All needle, instrument, and sponge counts were correct. DD:  04/07/00 TD:  04/08/00 Job: [id number]

## 2010-11-08 NOTE — Op Note (Signed)
   NAME:  ALAURA, Tonya Sanchez                          ACCOUNT NO.:  [id number]   MEDICAL RECORD NO.:  [id number]                   PATIENT TYPE:  AMB   LOCATION:  DSC                                  FACILITY:  [facility]   PHYSICIAN:  Loreta Ave, M.D.              DATE OF BIRTH:  1951/05/28   DATE OF PROCEDURE:  02/10/2002  DATE OF DISCHARGE:                                 OPERATIVE REPORT   PREOPERATIVE DIAGNOSIS:  Symptomatic ganglion, dorsal lateral aspect left  foot.   POSTOPERATIVE DIAGNOSIS:  Symptomatic ganglion, dorsal lateral aspect left  foot, ganglion emanating from fifth tarsometatarsal joint.   PROCEDURE:  Excision, ganglion, left foot.   SURGEON:  Loreta Ave, M.D.   ASSISTANT:  Arlys John D. Petrarca, P.A.-C.   ANESTHESIA:  Ankle block with sedation.   SPECIMENS:  Excised ganglion.   CULTURES:  None.   COMPLICATIONS:  None.   DRESSING:  Soft compressive.   TOURNIQUET TIME:  35 minutes.   DESCRIPTION OF PROCEDURE:  The patient brought to the operating room and  after adequate anesthesia had been obtained, tourniquet applied to the left  calf.  Prepped and draped in the usual sterile fashion.  Exsanguinated with  elevation and Esmarch and the tourniquet inflated to 250 mmHg.  The  ganglion, which was dorsolateral on the foot near the base of the fifth  metatarsal, was approached with a longitudinal incision, extending a  previous incision distally.  Skin and subcutaneous tissues divided.  The  sensory branches, neurovascular structures protected.  The ganglion  identified and excised in its entirety and tracked down to the fifth  tarsometatarsal joint and excised in its entirety.  Relatively good-sized.  Wound irrigated.  Hemostasis with cautery.  Deep tissues reapproximated with  Vicryl.  Skin closed with nylon.  Sterile compressive dressing applied with  a wooden shoe.  Tourniquet deflated and removed.  Anesthesia reversed.  Brought to the recovery room.   Tolerated the surgery well, no complications.                                               Loreta Ave, M.D.    DFM/MEDQ  D:  02/10/2002  T:  02/14/2002  Job:  [id number]

## 2010-11-08 NOTE — Op Note (Signed)
   NAME:  Tonya Sanchez, BARTEE                          ACCOUNT NO.:  [id number]   MEDICAL RECORD NO.:  [id number]                   PATIENT TYPE:  AMB   LOCATION:  DSC                                  FACILITY:  [facility]   PHYSICIAN:  Loreta Ave, M.D.              DATE OF BIRTH:  09-Sep-1950   DATE OF PROCEDURE:  04/12/2003  DATE OF DISCHARGE:                                 OPERATIVE REPORT   PREOPERATIVE DIAGNOSIS:  Right carpal tunnel syndrome.   POSTOPERATIVE DIAGNOSIS:  Right carpal tunnel syndrome.   OPERATION:  Right carpal tunnel release.   SURGEON:  Loreta Ave, M.D.   ASSISTANT:  Arlys John D. Petrarca, P.A.-C.   ANESTHESIA:  IV regional.   SPECIMENS:  None.   CULTURES:  None.   COMPLICATIONS:  None.   DRESSING:  Soft compressive with bulky hand dressing was placed.   DESCRIPTION OF PROCEDURE:  The patient was brought to the operating room and  placed on the operating room table in the supine position.  After adequate  anesthesia had been obtained, she was prepped and draped in the usual  sterile fashion.  A small incision along the volar aspect of the carpal  tunnel.  The skin and subcutaneous tissue were divided, avoiding injury to  the palmar branch of the median nerve.  The retinaculum over the carpal  tunnel was incised under direct visualization from the forearm fascia  proximally to the palmar arch distally.  The median nerve was exposed.  Decompressed throughout.  Moderate constriction of the middle of the canal  improved after an epineurotomy and an excision of the myxedematous tissue in  the carpal tunnel.  The digital branch and the motor branch were identified,  protected, and decompressed.  The wound was irrigated.  The skin was closed  with nylon.  The margins of the wound were injected with Marcaine.  A  sterile compressive dressing was applied, with a bulky hand dressing and  splint.  Anesthesia was reversed.   The patient was brought to the  recovery room.  She tolerated the surgery  well.  No complications.                                                Loreta Ave, M.D.    DFM/MEDQ  D:  04/12/2003  T:  04/12/2003  Job:  [id number]

## 2010-11-08 NOTE — Op Note (Signed)
[facility]  Patient:    Tonya Sanchez, Tonya Sanchez                    MRN: [id number] Proc. Date: 04/07/00 Adm. Date:  [id number] Attending:  Evlyn Clines CC:         Brook A. Edward Jolly, M.D.   Operative Report  PREOPERATIVE DIAGNOSIS:  Stress urinary incontinence.  POSTOPERATIVE DIAGNOSIS:  Stress urinary incontinence.  PROCEDURE:  Pubovaginal sling.  SURGEON:  Excell Seltzer. Annabell Howells, M.D.  ASSISTANTDebbe Bales A. Edward Jolly, M.D.  ANESTHESIA:  General.  DRAINS:  Suprapubic tube.  COMPLICATIONS:  None.  INDICATIONS:  [name] is a 60 year old white female with a history of prior Raz procedure, who has developed recurrent stress incontinence along with vaginal prolapse.  She is to undergo a _____ suspension with anterior and posterior repair by Dr. Edward Jolly.  I am going to do a pubovaginal sling.  FINDINGS AND PROCEDURE:  The patient was taken to the operating room after receiving 400 mg of Tequin.  A general anesthetic was induced.  She was fitted with PAS hose.  She was placed in the lithotomy position, and her mons was shaved.  She was given 5000 units of subcutaneous heparin.  Once in position, the patient was prepped with Betadine solution and draped in the usual sterile fashion.  A Foley catheter was inserted.  The bladder was drained.  At this point, Dr. Edward Jolly performed the initial dissection for the anterior repair after infiltrating the anterior vaginal wall with lidocaine with epinephrine. Once the vaginal mucosa had been reflected off the pubourethral and pubovesical fascia, I then entered the procedure.  A transverse incision was made over the pubis in the midline measuring approximately 3 cm.  The fat was then spread to the fascia using a Kelly.  A moist sponge was placed in this incision.  Using Strully scissors, the pubourethral fascia was pierced at its attachment to the pubis on each side, and a finger was passed into the retropubic space.  Once  the tracks had been created for passage of the suspension sutures, a 2 x 8 cm strip of Dermatrix which had been soaking in antibiotic solution was prepared by placing a #1 Prolene stitch through each end using quadruple helical bites.  The sling material was then secured over the proximal urethra and bladder neck using 2-0 Vicryl tacking sutures.  The Raz needle was then used in two passes to bring the suspension sutures from the vaginal incision to the abdominal incision.  Finger guidance was used to aid passage of the Raz needle.  Once the suspension sutures had been passed, a a cystoscope sheath was inserted in the urethra and held at a neutral angle while the suspension sutures were tied over hemostats under light tension. Once both were tied, they were tied across the midline to each other.  The long ends were cut, and it was tucked back in.  The wound was then irrigated with antibiotic solution.  The suprapubic incision was then closed using a running intracuticular 4-0 Vicryl.  The cystoscope was then placed through the sheath, and the bladder was inspected and no evidence of injury to the bladder wall was noted.  The sling appeared to be in good position.  The bladder was filled, the patient placed in the Trendelenburg position, and a Microvasive fader-tip suprapubic tube was placed through a separate stab wound just superior to the abdominal incision.  Once sufficiently in the bladder, the trocar  was removed, the coil was formed and secured, the string was trimmed short, and the catheter was connected to drainage tubing.  The suprapubic tube was secured in position using a 0 silk suture.  At this point, my portion of the procedure was complete.  I then turned things back over to Dr. Edward Jolly, who completed anterior repair and the remainder of the vault prolapse surgery. There were no complications during my portion of the procedure. DD:  04/07/00 TD:  04/07/00 Job:  [id number]

## 2011-04-01 ENCOUNTER — Ambulatory Visit (HOSPITAL_BASED_OUTPATIENT_CLINIC_OR_DEPARTMENT_OTHER)
Admission: RE | Admit: 2011-04-01 | Discharge: 2011-04-01 | Disposition: A | Discharge status: Home / Self Care | Payer: Medicare Other | Source: Ambulatory Visit | Attending: Urology | Admitting: Urology

## 2011-04-01 ENCOUNTER — Other Ambulatory Visit: Payer: Self-pay | Admitting: Urology

## 2011-04-01 DIAGNOSIS — R3129 Other microscopic hematuria: Secondary | ICD-10-CM | POA: Insufficient documentation

## 2011-04-01 DIAGNOSIS — K219 Gastro-esophageal reflux disease without esophagitis: Secondary | ICD-10-CM | POA: Insufficient documentation

## 2011-04-01 DIAGNOSIS — N329 Bladder disorder, unspecified: Secondary | ICD-10-CM | POA: Insufficient documentation

## 2011-04-01 DIAGNOSIS — I10 Essential (primary) hypertension: Secondary | ICD-10-CM | POA: Insufficient documentation

## 2011-04-01 DIAGNOSIS — Z86718 Personal history of other venous thrombosis and embolism: Secondary | ICD-10-CM | POA: Insufficient documentation

## 2011-04-01 DIAGNOSIS — Z8673 Personal history of transient ischemic attack (TIA), and cerebral infarction without residual deficits: Secondary | ICD-10-CM | POA: Insufficient documentation

## 2011-04-01 HISTORY — PX: CYSTOSCOPY WITH BIOPSY: SHX5122

## 2011-04-01 LAB — POCT I-STAT 4, (NA,K, GLUC, HGB,HCT)
Glucose, Bld: 119 mg/dL — ABNORMAL HIGH (ref 70–99)
HCT: 40 % (ref 36.0–46.0)

## 2011-04-03 NOTE — Op Note (Signed)
  NAME:  Tonya Sanchez, Tonya Sanchez NO.:  [id number]  MEDICAL RECORD NO.:  [id number]  LOCATION:                                 FACILITY:  PHYSICIAN:  Natalia Leatherwood, MD         DATE OF BIRTH:  DATE OF PROCEDURE:  04/01/2011 DATE OF DISCHARGE:                              OPERATIVE REPORT   PREOPERATIVE DIAGNOSIS:  Microscopic hematuria and erythematous bladder lesions.  POSTOPERATIVE DIAGNOSIS:  .Microscopic  hematuria and erythematous bladder lesions.  PROCEDURES PERFORMED: 1. Cystoscopy. 2. Bladder biopsy x4.  FINDINGS:  Erythematous bladder lesions.  SPECIMENS:  Of bladder biopsy are as follows: 1. Posterior bladder wall. 2. Left posterior bladder wall. 3. Bladder trigone. 4. Bladder dome.  ESTIMATED BLOOD LOSS:  None.  HISTORY OF PRESENT ILLNESS:  This is a patient with microscopic hematuria.  She had a full workup for this including upper-tract imaging and office cystoscopy.  Office cystoscopy revealed some erythematous bladder areas that could not be diagnosed upon visualization alone.  I discussed the risks and benefits of bladder biopsy with the patient. She elected to proceed with bladder biopsy and presents today for that procedure.  PROCEDURE:  Informed consent was obtained, the patient was taken to the operating room where she was placed in the supine position.  IV antibiotics were infused and general anesthesia was induced.  Time-out was performed in which the correct patient, surgical site, and procedure were identified and agreed upon by the surgical team.  Following this, she was placed in a dorsal lithotomy position where the genitals were prepped and draped in the usual sterile fashion.  Following this, a 12 degree scope was advanced through the urethra and the bladder was inspected with the 12 degree and 70 degree lens.  There were bladder lesions that were erythematous at 4 different sites.  Cold cup biopsy forceps were used to biopsy  these areas on the posterior bladder wall, bladder dome and trigone.  Bugbee electrode was used to fulgurate the biopsy sites until there was good hemostasis.  Each specimen was sent separately to Pathology.  After this, there was good hemostasis, her bladder was drained and 10 cc of lidocaine jelly was placed into the urethra.  She was placed back in a supine position. Anesthesia was reversed and she was taken to PACU in stable condition. I will contact her with the results of the biopsy.          ______________________________ Natalia Leatherwood, MD     DW/MEDQ  D:  04/01/2011  T:  04/01/2011  Job:  [id number]  Electronically Signed by Natalia Leatherwood MD on 04/03/2011 04:51:07 PM

## 2011-07-24 DIAGNOSIS — K922 Gastrointestinal hemorrhage, unspecified: Secondary | ICD-10-CM | POA: Diagnosis not present

## 2011-09-17 DIAGNOSIS — Z8673 Personal history of transient ischemic attack (TIA), and cerebral infarction without residual deficits: Secondary | ICD-10-CM | POA: Diagnosis not present

## 2011-09-17 DIAGNOSIS — H8120 Vestibular neuronitis, unspecified ear: Secondary | ICD-10-CM | POA: Diagnosis not present

## 2011-09-17 DIAGNOSIS — R112 Nausea with vomiting, unspecified: Secondary | ICD-10-CM | POA: Diagnosis not present

## 2011-09-17 DIAGNOSIS — G35 Multiple sclerosis: Secondary | ICD-10-CM | POA: Diagnosis not present

## 2011-09-17 DIAGNOSIS — R5383 Other fatigue: Secondary | ICD-10-CM | POA: Diagnosis not present

## 2011-09-17 DIAGNOSIS — R404 Transient alteration of awareness: Secondary | ICD-10-CM | POA: Diagnosis not present

## 2011-09-17 DIAGNOSIS — R5381 Other malaise: Secondary | ICD-10-CM | POA: Diagnosis not present

## 2011-09-17 DIAGNOSIS — I10 Essential (primary) hypertension: Secondary | ICD-10-CM | POA: Diagnosis not present

## 2011-09-17 DIAGNOSIS — R079 Chest pain, unspecified: Secondary | ICD-10-CM | POA: Diagnosis not present

## 2011-10-22 DIAGNOSIS — D539 Nutritional anemia, unspecified: Secondary | ICD-10-CM | POA: Diagnosis not present

## 2011-10-22 DIAGNOSIS — Z79899 Other long term (current) drug therapy: Secondary | ICD-10-CM | POA: Diagnosis not present

## 2011-10-22 DIAGNOSIS — I10 Essential (primary) hypertension: Secondary | ICD-10-CM | POA: Diagnosis not present

## 2011-10-22 DIAGNOSIS — I498 Other specified cardiac arrhythmias: Secondary | ICD-10-CM | POA: Diagnosis not present

## 2011-10-22 DIAGNOSIS — E782 Mixed hyperlipidemia: Secondary | ICD-10-CM | POA: Diagnosis not present

## 2011-12-03 DIAGNOSIS — Z1212 Encounter for screening for malignant neoplasm of rectum: Secondary | ICD-10-CM | POA: Diagnosis not present

## 2011-12-03 DIAGNOSIS — Z1289 Encounter for screening for malignant neoplasm of other sites: Secondary | ICD-10-CM | POA: Diagnosis not present

## 2011-12-03 DIAGNOSIS — Z1231 Encounter for screening mammogram for malignant neoplasm of breast: Secondary | ICD-10-CM | POA: Diagnosis not present

## 2012-02-25 DIAGNOSIS — M779 Enthesopathy, unspecified: Secondary | ICD-10-CM | POA: Diagnosis not present

## 2012-02-25 DIAGNOSIS — M79609 Pain in unspecified limb: Secondary | ICD-10-CM | POA: Diagnosis not present

## 2012-02-25 DIAGNOSIS — M19079 Primary osteoarthritis, unspecified ankle and foot: Secondary | ICD-10-CM | POA: Diagnosis not present

## 2012-02-25 DIAGNOSIS — M722 Plantar fascial fibromatosis: Secondary | ICD-10-CM | POA: Diagnosis not present

## 2012-03-10 DIAGNOSIS — Z79899 Other long term (current) drug therapy: Secondary | ICD-10-CM | POA: Diagnosis not present

## 2012-03-10 DIAGNOSIS — M79609 Pain in unspecified limb: Secondary | ICD-10-CM | POA: Diagnosis not present

## 2012-03-10 DIAGNOSIS — M779 Enthesopathy, unspecified: Secondary | ICD-10-CM | POA: Diagnosis not present

## 2012-03-31 DIAGNOSIS — Z23 Encounter for immunization: Secondary | ICD-10-CM | POA: Diagnosis not present

## 2012-06-04 DIAGNOSIS — J209 Acute bronchitis, unspecified: Secondary | ICD-10-CM | POA: Diagnosis not present

## 2012-06-04 DIAGNOSIS — J0100 Acute maxillary sinusitis, unspecified: Secondary | ICD-10-CM | POA: Diagnosis not present

## 2012-06-30 DIAGNOSIS — I10 Essential (primary) hypertension: Secondary | ICD-10-CM | POA: Diagnosis not present

## 2012-06-30 DIAGNOSIS — H8110 Benign paroxysmal vertigo, unspecified ear: Secondary | ICD-10-CM | POA: Diagnosis not present

## 2012-06-30 DIAGNOSIS — Z23 Encounter for immunization: Secondary | ICD-10-CM | POA: Diagnosis not present

## 2012-06-30 DIAGNOSIS — G35 Multiple sclerosis: Secondary | ICD-10-CM | POA: Diagnosis not present

## 2012-08-09 DIAGNOSIS — M79609 Pain in unspecified limb: Secondary | ICD-10-CM | POA: Diagnosis not present

## 2012-09-20 DIAGNOSIS — G8929 Other chronic pain: Secondary | ICD-10-CM | POA: Diagnosis not present

## 2012-09-20 DIAGNOSIS — M25539 Pain in unspecified wrist: Secondary | ICD-10-CM | POA: Diagnosis not present

## 2012-09-28 DIAGNOSIS — M654 Radial styloid tenosynovitis [de Quervain]: Secondary | ICD-10-CM | POA: Diagnosis not present

## 2012-10-19 DIAGNOSIS — M654 Radial styloid tenosynovitis [de Quervain]: Secondary | ICD-10-CM | POA: Diagnosis not present

## 2012-10-21 ENCOUNTER — Other Ambulatory Visit: Payer: Self-pay | Admitting: Orthopedic Surgery

## 2012-10-21 DIAGNOSIS — R2230 Localized swelling, mass and lump, unspecified upper limb: Secondary | ICD-10-CM

## 2012-10-21 DIAGNOSIS — M65839 Other synovitis and tenosynovitis, unspecified forearm: Secondary | ICD-10-CM

## 2012-10-21 HISTORY — DX: Other synovitis and tenosynovitis, unspecified forearm: M65.839

## 2012-10-21 HISTORY — DX: Localized swelling, mass and lump, unspecified upper limb: R22.30

## 2012-10-22 ENCOUNTER — Encounter (HOSPITAL_BASED_OUTPATIENT_CLINIC_OR_DEPARTMENT_OTHER): Payer: Self-pay | Admitting: *Deleted

## 2012-10-22 NOTE — Pre-Procedure Instructions (Signed)
To come for BMET and EKG 

## 2012-10-26 ENCOUNTER — Encounter (HOSPITAL_BASED_OUTPATIENT_CLINIC_OR_DEPARTMENT_OTHER)
Admission: RE | Admit: 2012-10-26 | Discharge: 2012-10-26 | Disposition: A | Discharge status: Home / Self Care | Payer: Medicare Other | Source: Ambulatory Visit | Attending: Orthopedic Surgery | Admitting: Orthopedic Surgery

## 2012-10-26 DIAGNOSIS — Z8673 Personal history of transient ischemic attack (TIA), and cerebral infarction without residual deficits: Secondary | ICD-10-CM | POA: Diagnosis not present

## 2012-10-26 DIAGNOSIS — I10 Essential (primary) hypertension: Secondary | ICD-10-CM | POA: Diagnosis not present

## 2012-10-26 DIAGNOSIS — Z885 Allergy status to narcotic agent status: Secondary | ICD-10-CM | POA: Diagnosis not present

## 2012-10-26 DIAGNOSIS — M65839 Other synovitis and tenosynovitis, unspecified forearm: Secondary | ICD-10-CM | POA: Diagnosis not present

## 2012-10-26 DIAGNOSIS — M654 Radial styloid tenosynovitis [de Quervain]: Secondary | ICD-10-CM | POA: Diagnosis not present

## 2012-10-26 DIAGNOSIS — Z79899 Other long term (current) drug therapy: Secondary | ICD-10-CM | POA: Diagnosis not present

## 2012-10-26 DIAGNOSIS — Z7982 Long term (current) use of aspirin: Secondary | ICD-10-CM | POA: Diagnosis not present

## 2012-10-26 DIAGNOSIS — Z86718 Personal history of other venous thrombosis and embolism: Secondary | ICD-10-CM | POA: Diagnosis not present

## 2012-10-26 DIAGNOSIS — Z6839 Body mass index (BMI) 39.0-39.9, adult: Secondary | ICD-10-CM | POA: Diagnosis not present

## 2012-10-26 DIAGNOSIS — M129 Arthropathy, unspecified: Secondary | ICD-10-CM | POA: Diagnosis not present

## 2012-10-26 DIAGNOSIS — K219 Gastro-esophageal reflux disease without esophagitis: Secondary | ICD-10-CM | POA: Diagnosis not present

## 2012-10-26 DIAGNOSIS — L723 Sebaceous cyst: Secondary | ICD-10-CM | POA: Diagnosis not present

## 2012-10-26 DIAGNOSIS — Z888 Allergy status to other drugs, medicaments and biological substances status: Secondary | ICD-10-CM | POA: Diagnosis not present

## 2012-10-26 DIAGNOSIS — E119 Type 2 diabetes mellitus without complications: Secondary | ICD-10-CM | POA: Diagnosis not present

## 2012-10-26 LAB — BASIC METABOLIC PANEL
BUN: 16 mg/dL (ref 6–23)
Chloride: 101 mEq/L (ref 96–112)
GFR calc Af Amer: 82 mL/min — ABNORMAL LOW (ref >90)
GFR calc non Af Amer: 71 mL/min — ABNORMAL LOW (ref >90)
Glucose, Bld: 116 mg/dL — ABNORMAL HIGH (ref 70–99)
Potassium: 3.9 mEq/L (ref 3.5–5.1)
Sodium: 137 mEq/L (ref 135–145)

## 2012-10-27 ENCOUNTER — Encounter (HOSPITAL_BASED_OUTPATIENT_CLINIC_OR_DEPARTMENT_OTHER): Payer: Self-pay | Admitting: Certified Registered Nurse Anesthetist

## 2012-10-27 ENCOUNTER — Ambulatory Visit (HOSPITAL_BASED_OUTPATIENT_CLINIC_OR_DEPARTMENT_OTHER): Payer: Medicare Other | Admitting: Certified Registered Nurse Anesthetist

## 2012-10-27 ENCOUNTER — Encounter (HOSPITAL_BASED_OUTPATIENT_CLINIC_OR_DEPARTMENT_OTHER): Payer: Self-pay

## 2012-10-27 ENCOUNTER — Encounter (HOSPITAL_BASED_OUTPATIENT_CLINIC_OR_DEPARTMENT_OTHER)
Admission: RE | Disposition: A | Discharge status: Home / Self Care | Payer: Self-pay | Source: Ambulatory Visit | Attending: Orthopedic Surgery

## 2012-10-27 ENCOUNTER — Ambulatory Visit (HOSPITAL_BASED_OUTPATIENT_CLINIC_OR_DEPARTMENT_OTHER)
Admission: RE | Admit: 2012-10-27 | Discharge: 2012-10-27 | Disposition: A | Discharge status: Home / Self Care | Payer: Medicare Other | Source: Ambulatory Visit | Attending: Orthopedic Surgery | Admitting: Orthopedic Surgery

## 2012-10-27 DIAGNOSIS — M654 Radial styloid tenosynovitis [de Quervain]: Secondary | ICD-10-CM | POA: Insufficient documentation

## 2012-10-27 DIAGNOSIS — Z86718 Personal history of other venous thrombosis and embolism: Secondary | ICD-10-CM | POA: Insufficient documentation

## 2012-10-27 DIAGNOSIS — M659 Synovitis and tenosynovitis, unspecified: Secondary | ICD-10-CM

## 2012-10-27 DIAGNOSIS — M65849 Other synovitis and tenosynovitis, unspecified hand: Secondary | ICD-10-CM | POA: Insufficient documentation

## 2012-10-27 DIAGNOSIS — M65839 Other synovitis and tenosynovitis, unspecified forearm: Secondary | ICD-10-CM | POA: Insufficient documentation

## 2012-10-27 DIAGNOSIS — I10 Essential (primary) hypertension: Secondary | ICD-10-CM | POA: Insufficient documentation

## 2012-10-27 DIAGNOSIS — Z8673 Personal history of transient ischemic attack (TIA), and cerebral infarction without residual deficits: Secondary | ICD-10-CM | POA: Insufficient documentation

## 2012-10-27 DIAGNOSIS — K219 Gastro-esophageal reflux disease without esophagitis: Secondary | ICD-10-CM | POA: Insufficient documentation

## 2012-10-27 DIAGNOSIS — Z6839 Body mass index (BMI) 39.0-39.9, adult: Secondary | ICD-10-CM | POA: Insufficient documentation

## 2012-10-27 DIAGNOSIS — Z79899 Other long term (current) drug therapy: Secondary | ICD-10-CM | POA: Insufficient documentation

## 2012-10-27 DIAGNOSIS — M6020 Foreign body granuloma of soft tissue, not elsewhere classified, unspecified site: Secondary | ICD-10-CM | POA: Diagnosis not present

## 2012-10-27 DIAGNOSIS — M129 Arthropathy, unspecified: Secondary | ICD-10-CM | POA: Insufficient documentation

## 2012-10-27 DIAGNOSIS — Z888 Allergy status to other drugs, medicaments and biological substances status: Secondary | ICD-10-CM | POA: Insufficient documentation

## 2012-10-27 DIAGNOSIS — Z885 Allergy status to narcotic agent status: Secondary | ICD-10-CM | POA: Insufficient documentation

## 2012-10-27 DIAGNOSIS — Z7982 Long term (current) use of aspirin: Secondary | ICD-10-CM | POA: Insufficient documentation

## 2012-10-27 DIAGNOSIS — L723 Sebaceous cyst: Secondary | ICD-10-CM | POA: Insufficient documentation

## 2012-10-27 DIAGNOSIS — R229 Localized swelling, mass and lump, unspecified: Secondary | ICD-10-CM | POA: Diagnosis not present

## 2012-10-27 HISTORY — DX: Anxiety disorder, unspecified: F41.9

## 2012-10-27 HISTORY — DX: Transient cerebral ischemic attack, unspecified: G45.9

## 2012-10-27 HISTORY — DX: Personal history of other venous thrombosis and embolism: Z86.718

## 2012-10-27 HISTORY — DX: Unspecified osteoarthritis, unspecified site: M19.90

## 2012-10-27 HISTORY — DX: Gastro-esophageal reflux disease without esophagitis: K21.9

## 2012-10-27 HISTORY — DX: Other synovitis and tenosynovitis, unspecified forearm: M65.839

## 2012-10-27 HISTORY — DX: Localized swelling, mass and lump, unspecified upper limb: R22.30

## 2012-10-27 HISTORY — DX: Essential (primary) hypertension: I10

## 2012-10-27 HISTORY — PX: CARPAL TUNNEL RELEASE: SHX101

## 2012-10-27 SURGERY — CARPAL TUNNEL RELEASE
Anesthesia: General | Site: Wrist | Laterality: Left | Wound class: Clean

## 2012-10-27 MED ORDER — OXYCODONE HCL 5 MG/5ML PO SOLN: 5.0000 mg | Freq: Once | ORAL | Status: DC | PRN

## 2012-10-27 MED ORDER — CHLORHEXIDINE GLUCONATE 4 % EX LIQD: 60.0000 mL | Freq: Once | CUTANEOUS | Status: DC

## 2012-10-27 MED ORDER — CEFAZOLIN SODIUM-DEXTROSE 2-3 GM-% IV SOLR
INTRAVENOUS | Status: DC | PRN
  Administered 2012-10-27: 2 g via INTRAVENOUS

## 2012-10-27 MED ORDER — HYDROCODONE-IBUPROFEN 5-200 MG PO TABS: 1.0000 | ORAL_TABLET | Freq: Three times a day (TID) | ORAL | Status: DC | PRN

## 2012-10-27 MED ORDER — PROPOFOL 10 MG/ML IV BOLUS
INTRAVENOUS | Status: DC | PRN
  Administered 2012-10-27: 250 mg via INTRAVENOUS

## 2012-10-27 MED ORDER — DEXAMETHASONE SODIUM PHOSPHATE 10 MG/ML IJ SOLN
INTRAMUSCULAR | Status: DC | PRN
  Administered 2012-10-27: 10 mg via INTRAVENOUS

## 2012-10-27 MED ORDER — MIDAZOLAM HCL 2 MG/ML PO SYRP: 12.0000 mg | ORAL_SOLUTION | Freq: Once | ORAL | Status: DC | PRN

## 2012-10-27 MED ORDER — PROMETHAZINE HCL 25 MG/ML IJ SOLN
6.2500 mg | INTRAMUSCULAR | Status: AC | PRN
  Administered 2012-10-27 (×2): 6.25 mg via INTRAVENOUS

## 2012-10-27 MED ORDER — ONDANSETRON HCL 4 MG/2ML IJ SOLN: 4.0000 mg | Freq: Once | INTRAMUSCULAR | Status: DC | PRN

## 2012-10-27 MED ORDER — LIDOCAINE HCL (CARDIAC) 20 MG/ML IV SOLN
INTRAVENOUS | Status: DC | PRN
  Administered 2012-10-27: 60 mg via INTRAVENOUS

## 2012-10-27 MED ORDER — BUPIVACAINE HCL (PF) 0.25 % IJ SOLN
INTRAMUSCULAR | Status: DC | PRN
  Administered 2012-10-27: 10 mL

## 2012-10-27 MED ORDER — ONDANSETRON HCL 4 MG/2ML IJ SOLN
INTRAMUSCULAR | Status: DC | PRN
  Administered 2012-10-27: 4 mg via INTRAVENOUS

## 2012-10-27 MED ORDER — MIDAZOLAM HCL 2 MG/2ML IJ SOLN: 1.0000 mg | INTRAMUSCULAR | Status: DC | PRN

## 2012-10-27 MED ORDER — FENTANYL CITRATE 0.05 MG/ML IJ SOLN
INTRAMUSCULAR | Status: DC | PRN
  Administered 2012-10-27: 25 ug via INTRAVENOUS
  Administered 2012-10-27: 50 ug via INTRAVENOUS
  Administered 2012-10-27 (×3): 25 ug via INTRAVENOUS

## 2012-10-27 MED ORDER — LACTATED RINGERS IV SOLN
INTRAVENOUS | Status: DC
  Administered 2012-10-27: 20 mL/h via INTRAVENOUS
  Administered 2012-10-27: 10:00:00 via INTRAVENOUS

## 2012-10-27 MED ORDER — OXYCODONE HCL 5 MG PO TABS: 5.0000 mg | ORAL_TABLET | Freq: Once | ORAL | Status: DC | PRN

## 2012-10-27 MED ORDER — FENTANYL CITRATE 0.05 MG/ML IJ SOLN: 50.0000 ug | INTRAMUSCULAR | Status: DC | PRN

## 2012-10-27 MED ORDER — MIDAZOLAM HCL 5 MG/5ML IJ SOLN
INTRAMUSCULAR | Status: DC | PRN
  Administered 2012-10-27: 2 mg via INTRAVENOUS

## 2012-10-27 MED ORDER — HYDROMORPHONE HCL PF 1 MG/ML IJ SOLN: 0.2500 mg | INTRAMUSCULAR | Status: DC | PRN

## 2012-10-27 SURGICAL SUPPLY — 38 items
APL SKNCLS STERI-STRIP NONHPOA (GAUZE/BANDAGES/DRESSINGS) ×2
BANDAGE ELASTIC 4 VELCRO ST LF (GAUZE/BANDAGES/DRESSINGS) ×3 IMPLANT
BANDAGE GAUZE ELAST BULKY 4 IN (GAUZE/BANDAGES/DRESSINGS) ×3 IMPLANT
BENZOIN TINCTURE PRP APPL 2/3 (GAUZE/BANDAGES/DRESSINGS) ×3 IMPLANT
BLADE SURG 15 STRL LF DISP TIS (BLADE) ×2 IMPLANT
BLADE SURG 15 STRL SS (BLADE) ×3
BNDG CMPR 9X4 STRL LF SNTH (GAUZE/BANDAGES/DRESSINGS) ×2
BNDG ESMARK 4X9 LF (GAUZE/BANDAGES/DRESSINGS) ×3 IMPLANT
CLOTH BEACON ORANGE TIMEOUT ST (SAFETY) ×3 IMPLANT
CORDS BIPOLAR (ELECTRODE) IMPLANT
COVER TABLE BACK 60X90 (DRAPES) ×3 IMPLANT
CUFF TOURNIQUET SINGLE 18IN (TOURNIQUET CUFF) ×3 IMPLANT
DRAPE EXTREMITY T 121X128X90 (DRAPE) ×3 IMPLANT
DRAPE SURG 17X23 STRL (DRAPES) ×3 IMPLANT
DURAPREP 26ML APPLICATOR (WOUND CARE) ×3 IMPLANT
GLOVE BIO SURGEON STRL SZ8 (GLOVE) ×3 IMPLANT
GLOVE BIOGEL PI IND STRL 7.0 (GLOVE) ×2 IMPLANT
GLOVE BIOGEL PI INDICATOR 7.0 (GLOVE) ×1
GLOVE ECLIPSE 6.5 STRL STRAW (GLOVE) ×3 IMPLANT
GOWN PREVENTION PLUS XLARGE (GOWN DISPOSABLE) ×3 IMPLANT
GOWN PREVENTION PLUS XXLARGE (GOWN DISPOSABLE) ×3 IMPLANT
NEEDLE HYPO 25X1 1.5 SAFETY (NEEDLE) ×3 IMPLANT
NS IRRIG 1000ML POUR BTL (IV SOLUTION) ×3 IMPLANT
PACK BASIN DAY SURGERY FS (CUSTOM PROCEDURE TRAY) ×3 IMPLANT
PAD CAST 3X4 CTTN HI CHSV (CAST SUPPLIES) ×2 IMPLANT
PADDING CAST ABS 4INX4YD NS (CAST SUPPLIES) ×1
PADDING CAST ABS COTTON 4X4 ST (CAST SUPPLIES) ×2 IMPLANT
PADDING CAST COTTON 3X4 STRL (CAST SUPPLIES) ×3
SHEET MEDIUM DRAPE 40X70 STRL (DRAPES) ×3 IMPLANT
SPONGE GAUZE 4X4 12PLY (GAUZE/BANDAGES/DRESSINGS) ×3 IMPLANT
STOCKINETTE 4X48 STRL (DRAPES) ×3 IMPLANT
STRIP CLOSURE SKIN 1/2X4 (GAUZE/BANDAGES/DRESSINGS) ×3 IMPLANT
SUT PROLENE 3 0 PS 2 (SUTURE) ×3 IMPLANT
SUT VICRYL RAPIDE 4/0 PS 2 (SUTURE) IMPLANT
SYR BULB 3OZ (MISCELLANEOUS) ×3 IMPLANT
SYRINGE 10CC LL (SYRINGE) ×3 IMPLANT
TOWEL OR 17X24 6PK STRL BLUE (TOWEL DISPOSABLE) ×3 IMPLANT
UNDERPAD 30X30 INCONTINENT (UNDERPADS AND DIAPERS) ×3 IMPLANT

## 2012-10-27 NOTE — H&P (Signed)
Tonya Sanchez is an 62 y.o. female.   Chief Complaint: left wrist and hand pain HPI: as above with chronic left wrist STS and painful palmer mass  Past Medical History  Diagnosis Date  . TIA (transient ischemic attack) 03/2010    no deficits  . GERD (gastroesophageal reflux disease)   . Arthritis     "everywhere"  . Anxiety     occasional - no current med.  . Hypertension     under control with meds., has been on med. x 3-4 yr.  . Stenosing tenosynovitis of wrist 10/2012    left  . Mass of hand 10/2012    left  . History of DVT (deep vein thrombosis) 1960s    no problems since    Past Surgical History  Procedure Laterality Date  . Abdominal hysterectomy      partial  . Cholecystectomy    . Total knee arthroplasty Left 09/09/1999  . Tendon reconstruction Left     foot  . Foot arthrodesis, triple Bilateral   . Knee arthroscopy Right   . Foot surgery Bilateral     spurs removed from both little toes  . Closed manipulation knee with steriod injection Left 01/27/2000  . Examination under anesthesia Left 01/27/2000    ankle - with steroid injection  . Anterior and posterior vaginal repair  04/07/2000    with vaginal vault suspension  . Pubovaginal sling  04/07/2000  . Anterior cervical decomp/discectomy fusion  10/20/2000    C5-6  . Foot ganglion excision Left 02/10/2002  . Carpal tunnel release Right 04/12/2003  . Carpal tunnel release Left 05/25/2003  . Cystoscopy with biopsy  04/01/2011    bladder bx. x 4    History reviewed. No pertinent family history. Social History:  reports that she has never smoked. She has never used smokeless tobacco. She reports that she does not drink alcohol or use illicit drugs.  Allergies:  Allergies  Allergen Reactions  . Gadolinium Hives and Itching       . Percocet (Oxycodone-Acetaminophen) Itching    Medications Prior to Admission  Medication Sig Dispense Refill  . aspirin 325 MG tablet Take 325 mg by mouth daily.      Marland Kitchen  esomeprazole (NEXIUM) 40 MG capsule Take 40 mg by mouth daily before breakfast.      . hydrochlorothiazide (HYDRODIURIL) 25 MG tablet Take 25 mg by mouth daily.      . metoprolol succinate (TOPROL-XL) 100 MG 24 hr tablet Take 100 mg by mouth daily. Take with or immediately following a meal.      . naproxen sodium (ANAPROX) 220 MG tablet Take 220 mg by mouth 2 (two) times daily with a meal.      . valsartan (DIOVAN) 160 MG tablet Take 160 mg by mouth daily.        Results for orders placed during the hospital encounter of 10/27/12 (from the past 48 hour(s))  BASIC METABOLIC PANEL     Status: Abnormal   Collection Time    10/26/12  2:30 PM      Result Value Range   Sodium 137  135 - 145 mEq/L   Potassium 3.9  3.5 - 5.1 mEq/L   Chloride 101  96 - 112 mEq/L   CO2 26  19 - 32 mEq/L   Glucose, Bld 116 (*) 70 - 99 mg/dL   BUN 16  6 - 23 mg/dL   Creatinine, Ser 1.61  0.50 - 1.10 mg/dL   Calcium  9.8  8.4 - 10.5 mg/dL   GFR calc non Af Amer 71 (*) >90 mL/min   GFR calc Af Amer 82 (*) >90 mL/min   Comment:            The eGFR has been calculated     using the CKD EPI equation.     This calculation has not been     validated in all clinical     situations.     eGFR's persistently     <90 mL/min signify     possible Chronic Kidney Disease.   No results found.  Review of Systems  All other systems reviewed and are negative.    Height 5\' 7"  (1.702 m), weight 115.667 kg (255 lb). Physical Exam  Constitutional: She is oriented to person, place, and time. She appears well-developed and well-nourished.  HENT:  Head: Normocephalic and atraumatic.  Cardiovascular: Normal rate.   Respiratory: Effort normal.  Musculoskeletal:       Left wrist: She exhibits tenderness and bony tenderness.  Neurological: She is alert and oriented to person, place, and time.  Skin: Skin is warm.  Psychiatric: She has a normal mood and affect. Her behavior is normal. Judgment and thought content normal.      Assessment/Plan As above  Plan left wrist STS release and mass excision  Senta Kantor A 10/27/2012, 9:23 AM

## 2012-10-27 NOTE — Op Note (Signed)
See dictated note [phone]

## 2012-10-27 NOTE — Anesthesia Postprocedure Evaluation (Signed)
  Anesthesia Post-op Note  Patient: Tonya Sanchez  Procedure(s) Performed: Procedure(s): LEFT WRIST STENOSISNG TENOSYNOVITIS RELEASE (Left) LEFT HAND MASS EXCISION (Left)  Patient Location: PACU  Anesthesia Type:General  Level of Consciousness: awake, alert  and oriented  Airway and Oxygen Therapy: Patient Spontanous Breathing and Patient connected to face mask oxygen  Post-op Pain: mild  Post-op Assessment: Post-op Vital signs reviewed  Post-op Vital Signs: Reviewed  Complications: No apparent anesthesia complications

## 2012-10-27 NOTE — Anesthesia Preprocedure Evaluation (Addendum)
Anesthesia Evaluation  Patient identified by MRN, date of birth, ID band Patient awake    Reviewed: Allergy & Precautions, H&P , NPO status , Patient's Chart, lab work & pertinent test results, reviewed documented beta blocker date and time   Airway Mallampati: I TM Distance: >3 FB Neck ROM: Full    Dental  (+) Teeth Intact and Dental Advisory Given   Pulmonary  breath sounds clear to auscultation        Cardiovascular hypertension, Pt. on home beta blockers Rhythm:Regular Rate:Normal     Neuro/Psych PSYCHIATRIC DISORDERS Anxiety TIA Neuromuscular disease    GI/Hepatic GERD-  Medicated and Controlled,  Endo/Other  Morbid obesity  Renal/GU      Musculoskeletal   Abdominal   Peds  Hematology   Anesthesia Other Findings   Reproductive/Obstetrics                          Anesthesia Physical Anesthesia Plan  ASA: II  Anesthesia Plan: General   Post-op Pain Management:    Induction: Intravenous  Airway Management Planned: LMA  Additional Equipment:   Intra-op Plan:   Post-operative Plan: Extubation in OR  Informed Consent: I have reviewed the patients History and Physical, chart, labs and discussed the procedure including the risks, benefits and alternatives for the proposed anesthesia with the patient or authorized representative who has indicated his/her understanding and acceptance.   Dental advisory given  Plan Discussed with: Anesthesiologist and Surgeon  Anesthesia Plan Comments:         Anesthesia Quick Evaluation

## 2012-10-27 NOTE — Transfer of Care (Signed)
Immediate Anesthesia Transfer of Care Note  Patient: Tonya Sanchez  Procedure(s) Performed: Procedure(s): LEFT WRIST STENOSISNG TENOSYNOVITIS RELEASE (Left) LEFT HAND MASS EXCISION (Left)  Patient Location: PACU  Anesthesia Type:General  Level of Consciousness: awake and patient cooperative  Airway & Oxygen Therapy: Patient Spontanous Breathing and Patient connected to face mask oxygen  Post-op Assessment: Report given to PACU RN and Post -op Vital signs reviewed and stable  Post vital signs: Reviewed and stable  Complications: No apparent anesthesia complications

## 2012-10-27 NOTE — Anesthesia Procedure Notes (Signed)
Procedure Name: LMA Insertion Date/Time: 10/27/2012 10:16 AM Performed by: Khyle Goodell D Pre-anesthesia Checklist: Patient identified, Emergency Drugs available, Suction available and Patient being monitored Patient Re-evaluated:Patient Re-evaluated prior to inductionOxygen Delivery Method: Circle System Utilized Preoxygenation: Pre-oxygenation with 100% oxygen Intubation Type: IV induction Ventilation: Mask ventilation without difficulty LMA: LMA inserted LMA Size: 4.0 Number of attempts: 1 Airway Equipment and Method: bite block Placement Confirmation: positive ETCO2 Tube secured with: Tape Dental Injury: Teeth and Oropharynx as per pre-operative assessment

## 2012-10-28 ENCOUNTER — Encounter (HOSPITAL_BASED_OUTPATIENT_CLINIC_OR_DEPARTMENT_OTHER): Payer: Self-pay | Admitting: Orthopedic Surgery

## 2012-10-28 NOTE — Op Note (Signed)
NAMEDOTSIE, GILLETTE NO.:  [id number]  MEDICAL RECORD NO.:  [id number]  LOCATION:                                 FACILITY:  PHYSICIAN:  Artist Pais. Dixie Coppa, M.D.DATE OF BIRTH:  1951/06/01  DATE OF PROCEDURE:  10/27/2012 DATE OF DISCHARGE:                              OPERATIVE REPORT   PREOPERATIVE DIAGNOSIS:  Chronic left wrist de Quervain tenosynovitis and left hand palmar mass.  POSTOPERATIVE DIAGNOSIS:  Chronic left wrist de Quervain tenosynovitis and left hand palmar mass.  PROCEDURE:  Left wrist first dorsal compartment tenosynovectomy and left hand mass excision.  SURGEON:  Artist Pais. Mina Marble, M.D.  ASSISTANT:  None.  ANESTHESIA:  General.  No complications.  No drains.  The patient was taken to the operating suite.  After induction of general anesthesia, left upper extremity was prepped and draped in sterile fashion.  An Esmarch was used to exsanguinate the limb. Tourniquet inflated to 250 mmHg.  At this point in time, incision was made at first dorsal compartment, left wrist, extended over the radial styloid.  Skin was incised 4-5 cm.  Dissection was carried down at the first dorsal compartment.  Cephalic vein and superficial radial nerves were carefully identified and retracted.  First dorsal compartment was split at its dorsal most extent longitudinally freeing up the APL and EPB tendons.  There was a separate sheath of the EPB tendon.  This was incised.  Tenosynovectomy was performed.  The wound was irrigated and loosely closed with a 4-0 Vicryl Rapide subcuticular stitch.  Second incision was made on the palmar aspect of the left hand in the area of the thenar crease.  Skin was incised in elliptical fashion and a large inclusion cyst type area with embedded suture material was carefully excised.  The wound was irrigated and loosely closed with 4-0 Vicryl Rapide subcuticular stitch.  Steri-Strips, 4x4s, fluffs, and a radial gutter splint  was applied to the upper extremity.  The patient tolerated the procedures well and went to the recovery room in stable fashion.     Artist Pais Mina Marble, M.D.     MAW/MEDQ  D:  10/27/2012  T:  10/28/2012  Job:  [id number]

## 2013-03-11 DIAGNOSIS — Z23 Encounter for immunization: Secondary | ICD-10-CM | POA: Diagnosis not present

## 2013-05-04 DIAGNOSIS — I10 Essential (primary) hypertension: Secondary | ICD-10-CM | POA: Diagnosis not present

## 2013-05-04 DIAGNOSIS — G250 Essential tremor: Secondary | ICD-10-CM | POA: Diagnosis not present

## 2014-06-07 DIAGNOSIS — Z79899 Other long term (current) drug therapy: Secondary | ICD-10-CM | POA: Diagnosis not present

## 2014-06-07 DIAGNOSIS — E782 Mixed hyperlipidemia: Secondary | ICD-10-CM | POA: Diagnosis not present

## 2014-06-07 DIAGNOSIS — S161XXA Strain of muscle, fascia and tendon at neck level, initial encounter: Secondary | ICD-10-CM | POA: Diagnosis not present

## 2014-06-07 DIAGNOSIS — G250 Essential tremor: Secondary | ICD-10-CM | POA: Diagnosis not present

## 2014-06-07 DIAGNOSIS — R7302 Impaired glucose tolerance (oral): Secondary | ICD-10-CM | POA: Diagnosis not present

## 2014-06-07 DIAGNOSIS — I10 Essential (primary) hypertension: Secondary | ICD-10-CM | POA: Diagnosis not present

## 2014-11-01 DIAGNOSIS — M25579 Pain in unspecified ankle and joints of unspecified foot: Secondary | ICD-10-CM

## 2014-11-01 DIAGNOSIS — M84375A Stress fracture, left foot, initial encounter for fracture: Secondary | ICD-10-CM

## 2014-11-01 HISTORY — DX: Stress fracture, left foot, initial encounter for fracture: M84.375A

## 2014-11-01 HISTORY — DX: Pain in unspecified ankle and joints of unspecified foot: M25.579

## 2014-12-20 DIAGNOSIS — M25579 Pain in unspecified ankle and joints of unspecified foot: Secondary | ICD-10-CM | POA: Diagnosis not present

## 2014-12-20 DIAGNOSIS — M84375A Stress fracture, left foot, initial encounter for fracture: Secondary | ICD-10-CM | POA: Diagnosis not present

## 2015-01-09 DIAGNOSIS — M79642 Pain in left hand: Secondary | ICD-10-CM | POA: Diagnosis not present

## 2015-01-09 DIAGNOSIS — M79641 Pain in right hand: Secondary | ICD-10-CM | POA: Diagnosis not present

## 2015-01-09 DIAGNOSIS — M150 Primary generalized (osteo)arthritis: Secondary | ICD-10-CM | POA: Diagnosis not present

## 2015-06-21 DIAGNOSIS — G2581 Restless legs syndrome: Secondary | ICD-10-CM | POA: Diagnosis not present

## 2015-06-21 DIAGNOSIS — F410 Panic disorder [episodic paroxysmal anxiety] without agoraphobia: Secondary | ICD-10-CM | POA: Diagnosis not present

## 2015-06-21 DIAGNOSIS — R413 Other amnesia: Secondary | ICD-10-CM | POA: Diagnosis not present

## 2015-07-05 DIAGNOSIS — G35 Multiple sclerosis: Secondary | ICD-10-CM | POA: Diagnosis not present

## 2015-07-05 DIAGNOSIS — R413 Other amnesia: Secondary | ICD-10-CM | POA: Diagnosis not present

## 2015-07-11 DIAGNOSIS — R413 Other amnesia: Secondary | ICD-10-CM | POA: Diagnosis not present

## 2015-07-11 DIAGNOSIS — G35 Multiple sclerosis: Secondary | ICD-10-CM | POA: Diagnosis not present

## 2015-07-25 DIAGNOSIS — R413 Other amnesia: Secondary | ICD-10-CM | POA: Diagnosis not present

## 2015-07-25 DIAGNOSIS — G35 Multiple sclerosis: Secondary | ICD-10-CM | POA: Diagnosis not present

## 2015-07-31 DIAGNOSIS — G35 Multiple sclerosis: Secondary | ICD-10-CM | POA: Diagnosis not present

## 2015-07-31 DIAGNOSIS — R4189 Other symptoms and signs involving cognitive functions and awareness: Secondary | ICD-10-CM | POA: Diagnosis not present

## 2015-08-15 DIAGNOSIS — R413 Other amnesia: Secondary | ICD-10-CM | POA: Diagnosis not present

## 2015-08-15 DIAGNOSIS — R42 Dizziness and giddiness: Secondary | ICD-10-CM | POA: Diagnosis not present

## 2015-09-12 DIAGNOSIS — Z79899 Other long term (current) drug therapy: Secondary | ICD-10-CM | POA: Diagnosis not present

## 2015-09-12 DIAGNOSIS — E1165 Type 2 diabetes mellitus with hyperglycemia: Secondary | ICD-10-CM | POA: Diagnosis not present

## 2015-09-12 DIAGNOSIS — F4323 Adjustment disorder with mixed anxiety and depressed mood: Secondary | ICD-10-CM | POA: Diagnosis not present

## 2015-09-12 DIAGNOSIS — I10 Essential (primary) hypertension: Secondary | ICD-10-CM | POA: Diagnosis not present

## 2015-10-17 DIAGNOSIS — N3000 Acute cystitis without hematuria: Secondary | ICD-10-CM | POA: Diagnosis not present

## 2016-03-05 DIAGNOSIS — E782 Mixed hyperlipidemia: Secondary | ICD-10-CM | POA: Diagnosis not present

## 2016-03-05 DIAGNOSIS — Z9181 History of falling: Secondary | ICD-10-CM | POA: Diagnosis not present

## 2016-03-05 DIAGNOSIS — I10 Essential (primary) hypertension: Secondary | ICD-10-CM | POA: Diagnosis not present

## 2016-03-05 DIAGNOSIS — E1165 Type 2 diabetes mellitus with hyperglycemia: Secondary | ICD-10-CM | POA: Diagnosis not present

## 2016-03-05 DIAGNOSIS — Z0000 Encounter for general adult medical examination without abnormal findings: Secondary | ICD-10-CM | POA: Diagnosis not present

## 2016-03-05 DIAGNOSIS — Z1389 Encounter for screening for other disorder: Secondary | ICD-10-CM | POA: Diagnosis not present

## 2016-03-05 DIAGNOSIS — F4323 Adjustment disorder with mixed anxiety and depressed mood: Secondary | ICD-10-CM | POA: Diagnosis not present

## 2016-03-27 DIAGNOSIS — Z23 Encounter for immunization: Secondary | ICD-10-CM | POA: Diagnosis not present

## 2016-04-18 ENCOUNTER — Other Ambulatory Visit: Payer: Self-pay | Admitting: *Deleted

## 2016-04-29 DIAGNOSIS — H524 Presbyopia: Secondary | ICD-10-CM | POA: Diagnosis not present

## 2016-04-29 DIAGNOSIS — H52209 Unspecified astigmatism, unspecified eye: Secondary | ICD-10-CM | POA: Diagnosis not present

## 2016-04-29 DIAGNOSIS — H5213 Myopia, bilateral: Secondary | ICD-10-CM | POA: Diagnosis not present

## 2016-05-22 DIAGNOSIS — H8110 Benign paroxysmal vertigo, unspecified ear: Secondary | ICD-10-CM | POA: Diagnosis not present

## 2016-07-14 DIAGNOSIS — J40 Bronchitis, not specified as acute or chronic: Secondary | ICD-10-CM | POA: Diagnosis not present

## 2016-07-14 DIAGNOSIS — J329 Chronic sinusitis, unspecified: Secondary | ICD-10-CM | POA: Diagnosis not present

## 2016-08-21 DIAGNOSIS — F4323 Adjustment disorder with mixed anxiety and depressed mood: Secondary | ICD-10-CM | POA: Diagnosis not present

## 2016-08-21 DIAGNOSIS — Z79899 Other long term (current) drug therapy: Secondary | ICD-10-CM | POA: Diagnosis not present

## 2016-08-21 DIAGNOSIS — I10 Essential (primary) hypertension: Secondary | ICD-10-CM | POA: Diagnosis not present

## 2016-08-21 DIAGNOSIS — E1165 Type 2 diabetes mellitus with hyperglycemia: Secondary | ICD-10-CM | POA: Diagnosis not present

## 2016-08-21 DIAGNOSIS — E782 Mixed hyperlipidemia: Secondary | ICD-10-CM | POA: Diagnosis not present

## 2016-12-29 DIAGNOSIS — Z6837 Body mass index (BMI) 37.0-37.9, adult: Secondary | ICD-10-CM | POA: Diagnosis not present

## 2016-12-29 DIAGNOSIS — R251 Tremor, unspecified: Secondary | ICD-10-CM | POA: Diagnosis not present

## 2016-12-29 DIAGNOSIS — S60222A Contusion of left hand, initial encounter: Secondary | ICD-10-CM | POA: Diagnosis not present

## 2016-12-29 DIAGNOSIS — S60212A Contusion of left wrist, initial encounter: Secondary | ICD-10-CM | POA: Diagnosis not present

## 2017-01-08 DIAGNOSIS — M79641 Pain in right hand: Secondary | ICD-10-CM | POA: Diagnosis not present

## 2017-01-08 DIAGNOSIS — M79642 Pain in left hand: Secondary | ICD-10-CM | POA: Diagnosis not present

## 2017-01-08 DIAGNOSIS — M19049 Primary osteoarthritis, unspecified hand: Secondary | ICD-10-CM

## 2017-01-08 DIAGNOSIS — M13849 Other specified arthritis, unspecified hand: Secondary | ICD-10-CM | POA: Diagnosis not present

## 2017-01-08 HISTORY — DX: Primary osteoarthritis, unspecified hand: M19.049

## 2017-04-04 DIAGNOSIS — H524 Presbyopia: Secondary | ICD-10-CM | POA: Diagnosis not present

## 2017-04-10 DIAGNOSIS — E119 Type 2 diabetes mellitus without complications: Secondary | ICD-10-CM | POA: Diagnosis not present

## 2017-04-10 DIAGNOSIS — H11131 Conjunctival pigmentations, right eye: Secondary | ICD-10-CM | POA: Diagnosis not present

## 2017-04-10 DIAGNOSIS — H04123 Dry eye syndrome of bilateral lacrimal glands: Secondary | ICD-10-CM | POA: Diagnosis not present

## 2017-04-10 DIAGNOSIS — H2513 Age-related nuclear cataract, bilateral: Secondary | ICD-10-CM | POA: Diagnosis not present

## 2017-04-10 DIAGNOSIS — H43811 Vitreous degeneration, right eye: Secondary | ICD-10-CM | POA: Diagnosis not present

## 2017-04-10 DIAGNOSIS — H40013 Open angle with borderline findings, low risk, bilateral: Secondary | ICD-10-CM | POA: Diagnosis not present

## 2017-04-24 DIAGNOSIS — E119 Type 2 diabetes mellitus without complications: Secondary | ICD-10-CM | POA: Diagnosis not present

## 2017-04-24 DIAGNOSIS — H43811 Vitreous degeneration, right eye: Secondary | ICD-10-CM | POA: Diagnosis not present

## 2017-04-24 DIAGNOSIS — H40013 Open angle with borderline findings, low risk, bilateral: Secondary | ICD-10-CM | POA: Diagnosis not present

## 2017-04-24 DIAGNOSIS — H2513 Age-related nuclear cataract, bilateral: Secondary | ICD-10-CM | POA: Diagnosis not present

## 2017-04-24 DIAGNOSIS — H11131 Conjunctival pigmentations, right eye: Secondary | ICD-10-CM | POA: Diagnosis not present

## 2017-04-24 DIAGNOSIS — H04123 Dry eye syndrome of bilateral lacrimal glands: Secondary | ICD-10-CM | POA: Diagnosis not present

## 2017-04-29 DIAGNOSIS — Z23 Encounter for immunization: Secondary | ICD-10-CM | POA: Diagnosis not present

## 2017-04-30 DIAGNOSIS — Z79899 Other long term (current) drug therapy: Secondary | ICD-10-CM | POA: Diagnosis not present

## 2017-04-30 DIAGNOSIS — M79642 Pain in left hand: Secondary | ICD-10-CM | POA: Diagnosis not present

## 2017-04-30 DIAGNOSIS — M79641 Pain in right hand: Secondary | ICD-10-CM | POA: Diagnosis not present

## 2017-04-30 DIAGNOSIS — M1990 Unspecified osteoarthritis, unspecified site: Secondary | ICD-10-CM | POA: Diagnosis not present

## 2017-06-25 DIAGNOSIS — H6993 Unspecified Eustachian tube disorder, bilateral: Secondary | ICD-10-CM | POA: Diagnosis not present

## 2017-07-06 DIAGNOSIS — Z6837 Body mass index (BMI) 37.0-37.9, adult: Secondary | ICD-10-CM | POA: Diagnosis not present

## 2017-07-06 DIAGNOSIS — Z9181 History of falling: Secondary | ICD-10-CM | POA: Diagnosis not present

## 2017-07-06 DIAGNOSIS — F4323 Adjustment disorder with mixed anxiety and depressed mood: Secondary | ICD-10-CM | POA: Diagnosis not present

## 2017-07-06 DIAGNOSIS — Z0000 Encounter for general adult medical examination without abnormal findings: Secondary | ICD-10-CM | POA: Diagnosis not present

## 2017-07-06 DIAGNOSIS — E782 Mixed hyperlipidemia: Secondary | ICD-10-CM | POA: Diagnosis not present

## 2017-07-06 DIAGNOSIS — E119 Type 2 diabetes mellitus without complications: Secondary | ICD-10-CM | POA: Diagnosis not present

## 2017-07-06 DIAGNOSIS — Z79899 Other long term (current) drug therapy: Secondary | ICD-10-CM | POA: Diagnosis not present

## 2017-07-06 DIAGNOSIS — G250 Essential tremor: Secondary | ICD-10-CM | POA: Diagnosis not present

## 2017-07-06 DIAGNOSIS — I10 Essential (primary) hypertension: Secondary | ICD-10-CM | POA: Diagnosis not present

## 2017-08-19 DIAGNOSIS — R251 Tremor, unspecified: Secondary | ICD-10-CM | POA: Diagnosis not present

## 2017-08-19 DIAGNOSIS — M47816 Spondylosis without myelopathy or radiculopathy, lumbar region: Secondary | ICD-10-CM

## 2017-08-19 DIAGNOSIS — M549 Dorsalgia, unspecified: Secondary | ICD-10-CM | POA: Diagnosis not present

## 2017-08-19 DIAGNOSIS — Z79899 Other long term (current) drug therapy: Secondary | ICD-10-CM | POA: Diagnosis not present

## 2017-08-19 DIAGNOSIS — R2681 Unsteadiness on feet: Secondary | ICD-10-CM | POA: Diagnosis not present

## 2017-08-19 HISTORY — DX: Spondylosis without myelopathy or radiculopathy, lumbar region: M47.816

## 2017-10-13 DIAGNOSIS — M778 Other enthesopathies, not elsewhere classified: Secondary | ICD-10-CM

## 2017-10-13 DIAGNOSIS — M25531 Pain in right wrist: Secondary | ICD-10-CM | POA: Diagnosis not present

## 2017-10-13 DIAGNOSIS — M654 Radial styloid tenosynovitis [de Quervain]: Secondary | ICD-10-CM

## 2017-10-13 DIAGNOSIS — M25532 Pain in left wrist: Secondary | ICD-10-CM | POA: Diagnosis not present

## 2017-10-13 HISTORY — DX: Radial styloid tenosynovitis (de quervain): M65.4

## 2017-10-13 HISTORY — DX: Other enthesopathies, not elsewhere classified: M77.8

## 2017-10-21 DIAGNOSIS — R5383 Other fatigue: Secondary | ICD-10-CM | POA: Diagnosis not present

## 2017-10-21 DIAGNOSIS — M47816 Spondylosis without myelopathy or radiculopathy, lumbar region: Secondary | ICD-10-CM | POA: Diagnosis not present

## 2017-10-21 DIAGNOSIS — G250 Essential tremor: Secondary | ICD-10-CM

## 2017-10-21 HISTORY — DX: Essential tremor: G25.0

## 2017-10-29 DIAGNOSIS — I10 Essential (primary) hypertension: Secondary | ICD-10-CM | POA: Diagnosis not present

## 2017-10-29 DIAGNOSIS — M542 Cervicalgia: Secondary | ICD-10-CM | POA: Diagnosis not present

## 2017-10-29 DIAGNOSIS — R209 Unspecified disturbances of skin sensation: Secondary | ICD-10-CM | POA: Diagnosis not present

## 2017-10-29 DIAGNOSIS — R202 Paresthesia of skin: Secondary | ICD-10-CM | POA: Diagnosis not present

## 2017-10-29 DIAGNOSIS — E7800 Pure hypercholesterolemia, unspecified: Secondary | ICD-10-CM | POA: Diagnosis not present

## 2017-10-29 DIAGNOSIS — E119 Type 2 diabetes mellitus without complications: Secondary | ICD-10-CM | POA: Diagnosis not present

## 2017-10-29 DIAGNOSIS — M4802 Spinal stenosis, cervical region: Secondary | ICD-10-CM | POA: Diagnosis not present

## 2017-10-29 DIAGNOSIS — R51 Headache: Secondary | ICD-10-CM | POA: Diagnosis not present

## 2017-11-06 DIAGNOSIS — R413 Other amnesia: Secondary | ICD-10-CM | POA: Diagnosis not present

## 2017-11-06 DIAGNOSIS — G250 Essential tremor: Secondary | ICD-10-CM | POA: Diagnosis not present

## 2017-11-06 DIAGNOSIS — M47812 Spondylosis without myelopathy or radiculopathy, cervical region: Secondary | ICD-10-CM | POA: Diagnosis not present

## 2017-11-10 DIAGNOSIS — M654 Radial styloid tenosynovitis [de Quervain]: Secondary | ICD-10-CM | POA: Diagnosis not present

## 2017-11-10 DIAGNOSIS — M778 Other enthesopathies, not elsewhere classified: Secondary | ICD-10-CM | POA: Diagnosis not present

## 2017-11-20 DIAGNOSIS — Z6839 Body mass index (BMI) 39.0-39.9, adult: Secondary | ICD-10-CM | POA: Diagnosis not present

## 2017-11-20 DIAGNOSIS — M130 Polyarthritis, unspecified: Secondary | ICD-10-CM | POA: Diagnosis not present

## 2017-11-20 DIAGNOSIS — R2689 Other abnormalities of gait and mobility: Secondary | ICD-10-CM | POA: Diagnosis not present

## 2017-11-24 DIAGNOSIS — M47812 Spondylosis without myelopathy or radiculopathy, cervical region: Secondary | ICD-10-CM | POA: Diagnosis not present

## 2017-12-09 DIAGNOSIS — M5412 Radiculopathy, cervical region: Secondary | ICD-10-CM | POA: Diagnosis not present

## 2017-12-09 DIAGNOSIS — M542 Cervicalgia: Secondary | ICD-10-CM | POA: Diagnosis not present

## 2017-12-09 DIAGNOSIS — M47816 Spondylosis without myelopathy or radiculopathy, lumbar region: Secondary | ICD-10-CM | POA: Diagnosis not present

## 2017-12-09 DIAGNOSIS — G250 Essential tremor: Secondary | ICD-10-CM | POA: Diagnosis not present

## 2017-12-11 DIAGNOSIS — H2513 Age-related nuclear cataract, bilateral: Secondary | ICD-10-CM | POA: Diagnosis not present

## 2017-12-11 DIAGNOSIS — H43811 Vitreous degeneration, right eye: Secondary | ICD-10-CM | POA: Diagnosis not present

## 2017-12-11 DIAGNOSIS — H524 Presbyopia: Secondary | ICD-10-CM | POA: Diagnosis not present

## 2017-12-11 DIAGNOSIS — H04123 Dry eye syndrome of bilateral lacrimal glands: Secondary | ICD-10-CM | POA: Diagnosis not present

## 2017-12-11 DIAGNOSIS — H40033 Anatomical narrow angle, bilateral: Secondary | ICD-10-CM | POA: Diagnosis not present

## 2017-12-11 DIAGNOSIS — H5213 Myopia, bilateral: Secondary | ICD-10-CM | POA: Diagnosis not present

## 2017-12-11 DIAGNOSIS — H52209 Unspecified astigmatism, unspecified eye: Secondary | ICD-10-CM | POA: Diagnosis not present

## 2017-12-11 DIAGNOSIS — H11131 Conjunctival pigmentations, right eye: Secondary | ICD-10-CM | POA: Diagnosis not present

## 2017-12-11 DIAGNOSIS — E119 Type 2 diabetes mellitus without complications: Secondary | ICD-10-CM | POA: Diagnosis not present

## 2017-12-11 DIAGNOSIS — H40013 Open angle with borderline findings, low risk, bilateral: Secondary | ICD-10-CM | POA: Diagnosis not present

## 2017-12-16 DIAGNOSIS — R413 Other amnesia: Secondary | ICD-10-CM | POA: Diagnosis not present

## 2017-12-23 DIAGNOSIS — M4802 Spinal stenosis, cervical region: Secondary | ICD-10-CM

## 2017-12-23 DIAGNOSIS — M4712 Other spondylosis with myelopathy, cervical region: Secondary | ICD-10-CM | POA: Diagnosis not present

## 2017-12-23 DIAGNOSIS — M4722 Other spondylosis with radiculopathy, cervical region: Secondary | ICD-10-CM | POA: Insufficient documentation

## 2017-12-23 DIAGNOSIS — M5412 Radiculopathy, cervical region: Secondary | ICD-10-CM | POA: Diagnosis not present

## 2017-12-23 HISTORY — DX: Other spondylosis with radiculopathy, cervical region: M47.22

## 2017-12-23 HISTORY — DX: Spinal stenosis, cervical region: M48.02

## 2017-12-28 DIAGNOSIS — I10 Essential (primary) hypertension: Secondary | ICD-10-CM

## 2017-12-28 DIAGNOSIS — E119 Type 2 diabetes mellitus without complications: Secondary | ICD-10-CM

## 2017-12-28 HISTORY — DX: Essential (primary) hypertension: I10

## 2017-12-28 HISTORY — DX: Type 2 diabetes mellitus without complications: E11.9

## 2017-12-30 DIAGNOSIS — M659 Synovitis and tenosynovitis, unspecified: Secondary | ICD-10-CM | POA: Diagnosis not present

## 2017-12-30 DIAGNOSIS — M5090 Cervical disc disorder, unspecified, unspecified cervical region: Secondary | ICD-10-CM | POA: Diagnosis not present

## 2017-12-30 DIAGNOSIS — R2689 Other abnormalities of gait and mobility: Secondary | ICD-10-CM | POA: Diagnosis not present

## 2017-12-30 DIAGNOSIS — Z6841 Body Mass Index (BMI) 40.0 and over, adult: Secondary | ICD-10-CM | POA: Diagnosis not present

## 2017-12-30 DIAGNOSIS — M84376A Stress fracture, unspecified foot, initial encounter for fracture: Secondary | ICD-10-CM | POA: Diagnosis not present

## 2018-01-07 DIAGNOSIS — E119 Type 2 diabetes mellitus without complications: Secondary | ICD-10-CM | POA: Diagnosis not present

## 2018-01-07 DIAGNOSIS — Z01812 Encounter for preprocedural laboratory examination: Secondary | ICD-10-CM | POA: Diagnosis not present

## 2018-01-07 DIAGNOSIS — I10 Essential (primary) hypertension: Secondary | ICD-10-CM | POA: Diagnosis not present

## 2018-01-07 DIAGNOSIS — M4722 Other spondylosis with radiculopathy, cervical region: Secondary | ICD-10-CM | POA: Diagnosis not present

## 2018-01-07 DIAGNOSIS — M4802 Spinal stenosis, cervical region: Secondary | ICD-10-CM | POA: Diagnosis not present

## 2018-01-07 DIAGNOSIS — Z01818 Encounter for other preprocedural examination: Secondary | ICD-10-CM | POA: Diagnosis not present

## 2018-01-12 DIAGNOSIS — Z794 Long term (current) use of insulin: Secondary | ICD-10-CM | POA: Diagnosis not present

## 2018-01-12 DIAGNOSIS — M4802 Spinal stenosis, cervical region: Secondary | ICD-10-CM | POA: Diagnosis not present

## 2018-01-12 DIAGNOSIS — M4722 Other spondylosis with radiculopathy, cervical region: Secondary | ICD-10-CM | POA: Diagnosis not present

## 2018-01-12 DIAGNOSIS — M4322 Fusion of spine, cervical region: Secondary | ICD-10-CM | POA: Diagnosis not present

## 2018-01-12 DIAGNOSIS — I10 Essential (primary) hypertension: Secondary | ICD-10-CM | POA: Diagnosis not present

## 2018-01-12 DIAGNOSIS — E119 Type 2 diabetes mellitus without complications: Secondary | ICD-10-CM | POA: Diagnosis not present

## 2018-01-12 DIAGNOSIS — M4712 Other spondylosis with myelopathy, cervical region: Secondary | ICD-10-CM | POA: Diagnosis not present

## 2018-01-13 DIAGNOSIS — M4802 Spinal stenosis, cervical region: Secondary | ICD-10-CM | POA: Diagnosis not present

## 2018-01-13 DIAGNOSIS — Z9889 Other specified postprocedural states: Secondary | ICD-10-CM | POA: Diagnosis not present

## 2018-01-13 DIAGNOSIS — M4722 Other spondylosis with radiculopathy, cervical region: Secondary | ICD-10-CM | POA: Diagnosis not present

## 2018-01-13 DIAGNOSIS — E119 Type 2 diabetes mellitus without complications: Secondary | ICD-10-CM | POA: Diagnosis not present

## 2018-01-13 DIAGNOSIS — M4202 Juvenile osteochondrosis of spine, cervical region: Secondary | ICD-10-CM | POA: Diagnosis not present

## 2018-01-13 DIAGNOSIS — I10 Essential (primary) hypertension: Secondary | ICD-10-CM | POA: Diagnosis not present

## 2018-01-13 DIAGNOSIS — Z794 Long term (current) use of insulin: Secondary | ICD-10-CM | POA: Diagnosis not present

## 2018-01-26 DIAGNOSIS — G902 Horner's syndrome: Secondary | ICD-10-CM | POA: Diagnosis not present

## 2018-02-01 DIAGNOSIS — M5090 Cervical disc disorder, unspecified, unspecified cervical region: Secondary | ICD-10-CM | POA: Diagnosis not present

## 2018-02-01 DIAGNOSIS — M659 Synovitis and tenosynovitis, unspecified: Secondary | ICD-10-CM | POA: Diagnosis not present

## 2018-02-01 DIAGNOSIS — M84376A Stress fracture, unspecified foot, initial encounter for fracture: Secondary | ICD-10-CM | POA: Diagnosis not present

## 2018-02-12 DIAGNOSIS — G902 Horner's syndrome: Secondary | ICD-10-CM

## 2018-02-12 HISTORY — DX: Horner's syndrome: G90.2

## 2018-03-04 DIAGNOSIS — M5090 Cervical disc disorder, unspecified, unspecified cervical region: Secondary | ICD-10-CM | POA: Diagnosis not present

## 2018-03-04 DIAGNOSIS — M659 Synovitis and tenosynovitis, unspecified: Secondary | ICD-10-CM | POA: Diagnosis not present

## 2018-03-04 DIAGNOSIS — M84376A Stress fracture, unspecified foot, initial encounter for fracture: Secondary | ICD-10-CM | POA: Diagnosis not present

## 2018-03-09 DIAGNOSIS — M4316 Spondylolisthesis, lumbar region: Secondary | ICD-10-CM | POA: Diagnosis not present

## 2018-03-09 DIAGNOSIS — M4726 Other spondylosis with radiculopathy, lumbar region: Secondary | ICD-10-CM | POA: Diagnosis not present

## 2018-03-09 DIAGNOSIS — M549 Dorsalgia, unspecified: Secondary | ICD-10-CM | POA: Diagnosis not present

## 2018-03-16 DIAGNOSIS — M4316 Spondylolisthesis, lumbar region: Secondary | ICD-10-CM | POA: Diagnosis not present

## 2018-03-16 DIAGNOSIS — M545 Low back pain: Secondary | ICD-10-CM | POA: Diagnosis not present

## 2018-03-16 DIAGNOSIS — G902 Horner's syndrome: Secondary | ICD-10-CM | POA: Diagnosis not present

## 2018-03-22 DIAGNOSIS — M4316 Spondylolisthesis, lumbar region: Secondary | ICD-10-CM | POA: Diagnosis not present

## 2018-03-22 DIAGNOSIS — M545 Low back pain: Secondary | ICD-10-CM | POA: Diagnosis not present

## 2018-03-29 DIAGNOSIS — M4316 Spondylolisthesis, lumbar region: Secondary | ICD-10-CM | POA: Diagnosis not present

## 2018-03-29 DIAGNOSIS — M545 Low back pain: Secondary | ICD-10-CM | POA: Diagnosis not present

## 2018-03-29 DIAGNOSIS — Z0000 Encounter for general adult medical examination without abnormal findings: Secondary | ICD-10-CM | POA: Diagnosis not present

## 2018-04-02 DIAGNOSIS — M25512 Pain in left shoulder: Secondary | ICD-10-CM | POA: Diagnosis not present

## 2018-04-02 DIAGNOSIS — M4722 Other spondylosis with radiculopathy, cervical region: Secondary | ICD-10-CM | POA: Diagnosis not present

## 2018-04-02 DIAGNOSIS — M4802 Spinal stenosis, cervical region: Secondary | ICD-10-CM | POA: Diagnosis not present

## 2018-04-02 DIAGNOSIS — M47812 Spondylosis without myelopathy or radiculopathy, cervical region: Secondary | ICD-10-CM

## 2018-04-02 DIAGNOSIS — Z09 Encounter for follow-up examination after completed treatment for conditions other than malignant neoplasm: Secondary | ICD-10-CM | POA: Diagnosis not present

## 2018-04-02 DIAGNOSIS — Z981 Arthrodesis status: Secondary | ICD-10-CM

## 2018-04-02 HISTORY — DX: Spondylosis without myelopathy or radiculopathy, cervical region: M47.812

## 2018-04-02 HISTORY — DX: Arthrodesis status: Z98.1

## 2018-04-03 DIAGNOSIS — M5090 Cervical disc disorder, unspecified, unspecified cervical region: Secondary | ICD-10-CM | POA: Diagnosis not present

## 2018-04-03 DIAGNOSIS — M659 Synovitis and tenosynovitis, unspecified: Secondary | ICD-10-CM | POA: Diagnosis not present

## 2018-04-03 DIAGNOSIS — Z23 Encounter for immunization: Secondary | ICD-10-CM | POA: Diagnosis not present

## 2018-04-03 DIAGNOSIS — M84376A Stress fracture, unspecified foot, initial encounter for fracture: Secondary | ICD-10-CM | POA: Diagnosis not present

## 2018-04-06 DIAGNOSIS — Z1231 Encounter for screening mammogram for malignant neoplasm of breast: Secondary | ICD-10-CM | POA: Diagnosis not present

## 2018-04-15 DIAGNOSIS — M5126 Other intervertebral disc displacement, lumbar region: Secondary | ICD-10-CM | POA: Diagnosis not present

## 2018-04-15 DIAGNOSIS — M4807 Spinal stenosis, lumbosacral region: Secondary | ICD-10-CM | POA: Diagnosis not present

## 2018-04-15 DIAGNOSIS — M47816 Spondylosis without myelopathy or radiculopathy, lumbar region: Secondary | ICD-10-CM | POA: Diagnosis not present

## 2018-04-15 DIAGNOSIS — M4686 Other specified inflammatory spondylopathies, lumbar region: Secondary | ICD-10-CM | POA: Diagnosis not present

## 2018-04-15 DIAGNOSIS — M545 Low back pain: Secondary | ICD-10-CM | POA: Diagnosis not present

## 2018-04-15 DIAGNOSIS — M48061 Spinal stenosis, lumbar region without neurogenic claudication: Secondary | ICD-10-CM | POA: Diagnosis not present

## 2018-04-20 DIAGNOSIS — M4316 Spondylolisthesis, lumbar region: Secondary | ICD-10-CM | POA: Diagnosis not present

## 2018-04-20 DIAGNOSIS — Z6841 Body Mass Index (BMI) 40.0 and over, adult: Secondary | ICD-10-CM | POA: Diagnosis not present

## 2018-04-20 DIAGNOSIS — M4726 Other spondylosis with radiculopathy, lumbar region: Secondary | ICD-10-CM | POA: Diagnosis not present

## 2018-04-20 DIAGNOSIS — M48062 Spinal stenosis, lumbar region with neurogenic claudication: Secondary | ICD-10-CM | POA: Diagnosis not present

## 2018-04-23 DIAGNOSIS — E782 Mixed hyperlipidemia: Secondary | ICD-10-CM | POA: Diagnosis not present

## 2018-04-23 DIAGNOSIS — E119 Type 2 diabetes mellitus without complications: Secondary | ICD-10-CM | POA: Diagnosis not present

## 2018-04-29 DIAGNOSIS — Z6841 Body Mass Index (BMI) 40.0 and over, adult: Secondary | ICD-10-CM | POA: Diagnosis not present

## 2018-04-29 DIAGNOSIS — M7918 Myalgia, other site: Secondary | ICD-10-CM | POA: Diagnosis not present

## 2018-04-29 DIAGNOSIS — M47816 Spondylosis without myelopathy or radiculopathy, lumbar region: Secondary | ICD-10-CM | POA: Diagnosis not present

## 2018-04-29 DIAGNOSIS — I10 Essential (primary) hypertension: Secondary | ICD-10-CM | POA: Diagnosis not present

## 2018-05-03 DIAGNOSIS — I10 Essential (primary) hypertension: Secondary | ICD-10-CM | POA: Diagnosis not present

## 2018-05-03 DIAGNOSIS — E1169 Type 2 diabetes mellitus with other specified complication: Secondary | ICD-10-CM | POA: Diagnosis not present

## 2018-05-03 DIAGNOSIS — E7849 Other hyperlipidemia: Secondary | ICD-10-CM

## 2018-05-03 DIAGNOSIS — E669 Obesity, unspecified: Secondary | ICD-10-CM

## 2018-05-03 DIAGNOSIS — Z86718 Personal history of other venous thrombosis and embolism: Secondary | ICD-10-CM | POA: Insufficient documentation

## 2018-05-03 DIAGNOSIS — Z87440 Personal history of urinary (tract) infections: Secondary | ICD-10-CM

## 2018-05-03 DIAGNOSIS — M797 Fibromyalgia: Secondary | ICD-10-CM

## 2018-05-03 DIAGNOSIS — F331 Major depressive disorder, recurrent, moderate: Secondary | ICD-10-CM

## 2018-05-03 DIAGNOSIS — D494 Neoplasm of unspecified behavior of bladder: Secondary | ICD-10-CM

## 2018-05-03 DIAGNOSIS — E66811 Obesity, class 1: Secondary | ICD-10-CM

## 2018-05-03 DIAGNOSIS — K219 Gastro-esophageal reflux disease without esophagitis: Secondary | ICD-10-CM | POA: Diagnosis not present

## 2018-05-03 DIAGNOSIS — E1142 Type 2 diabetes mellitus with diabetic polyneuropathy: Secondary | ICD-10-CM | POA: Diagnosis not present

## 2018-05-03 DIAGNOSIS — G894 Chronic pain syndrome: Secondary | ICD-10-CM

## 2018-05-03 DIAGNOSIS — E785 Hyperlipidemia, unspecified: Secondary | ICD-10-CM | POA: Diagnosis not present

## 2018-05-03 HISTORY — DX: Obesity, class 1: E66.811

## 2018-05-03 HISTORY — DX: Neoplasm of unspecified behavior of bladder: D49.4

## 2018-05-03 HISTORY — DX: Fibromyalgia: M79.7

## 2018-05-03 HISTORY — DX: Other hyperlipidemia: E78.49

## 2018-05-03 HISTORY — DX: Chronic pain syndrome: G89.4

## 2018-05-03 HISTORY — DX: Major depressive disorder, recurrent, moderate: F33.1

## 2018-05-03 HISTORY — DX: Personal history of urinary (tract) infections: Z87.440

## 2018-05-03 HISTORY — DX: Obesity, unspecified: E66.9

## 2018-05-04 DIAGNOSIS — M84376A Stress fracture, unspecified foot, initial encounter for fracture: Secondary | ICD-10-CM | POA: Diagnosis not present

## 2018-05-04 DIAGNOSIS — M5090 Cervical disc disorder, unspecified, unspecified cervical region: Secondary | ICD-10-CM | POA: Diagnosis not present

## 2018-05-04 DIAGNOSIS — M659 Synovitis and tenosynovitis, unspecified: Secondary | ICD-10-CM | POA: Diagnosis not present

## 2018-05-06 DIAGNOSIS — K219 Gastro-esophageal reflux disease without esophagitis: Secondary | ICD-10-CM

## 2018-05-06 HISTORY — DX: Gastro-esophageal reflux disease without esophagitis: K21.9

## 2018-05-11 DIAGNOSIS — M47816 Spondylosis without myelopathy or radiculopathy, lumbar region: Secondary | ICD-10-CM | POA: Diagnosis not present

## 2018-05-19 DIAGNOSIS — H02422 Myogenic ptosis of left eyelid: Secondary | ICD-10-CM | POA: Diagnosis not present

## 2018-05-19 DIAGNOSIS — G902 Horner's syndrome: Secondary | ICD-10-CM | POA: Diagnosis not present

## 2018-05-19 DIAGNOSIS — H02431 Paralytic ptosis of right eyelid: Secondary | ICD-10-CM | POA: Diagnosis not present

## 2018-05-31 DIAGNOSIS — M47816 Spondylosis without myelopathy or radiculopathy, lumbar region: Secondary | ICD-10-CM | POA: Diagnosis not present

## 2018-06-03 DIAGNOSIS — M5090 Cervical disc disorder, unspecified, unspecified cervical region: Secondary | ICD-10-CM | POA: Diagnosis not present

## 2018-06-03 DIAGNOSIS — M659 Synovitis and tenosynovitis, unspecified: Secondary | ICD-10-CM | POA: Diagnosis not present

## 2018-06-03 DIAGNOSIS — M84376A Stress fracture, unspecified foot, initial encounter for fracture: Secondary | ICD-10-CM | POA: Diagnosis not present

## 2018-07-04 DIAGNOSIS — M84376A Stress fracture, unspecified foot, initial encounter for fracture: Secondary | ICD-10-CM | POA: Diagnosis not present

## 2018-07-04 DIAGNOSIS — M5090 Cervical disc disorder, unspecified, unspecified cervical region: Secondary | ICD-10-CM | POA: Diagnosis not present

## 2018-07-04 DIAGNOSIS — M659 Synovitis and tenosynovitis, unspecified: Secondary | ICD-10-CM | POA: Diagnosis not present

## 2018-07-05 DIAGNOSIS — M47816 Spondylosis without myelopathy or radiculopathy, lumbar region: Secondary | ICD-10-CM | POA: Diagnosis not present

## 2018-07-19 DIAGNOSIS — M47816 Spondylosis without myelopathy or radiculopathy, lumbar region: Secondary | ICD-10-CM | POA: Diagnosis not present

## 2018-07-20 DIAGNOSIS — G902 Horner's syndrome: Secondary | ICD-10-CM | POA: Diagnosis not present

## 2018-07-20 DIAGNOSIS — G909 Disorder of the autonomic nervous system, unspecified: Secondary | ICD-10-CM | POA: Diagnosis not present

## 2018-07-20 DIAGNOSIS — H02422 Myogenic ptosis of left eyelid: Secondary | ICD-10-CM | POA: Diagnosis not present

## 2018-08-04 DIAGNOSIS — M5090 Cervical disc disorder, unspecified, unspecified cervical region: Secondary | ICD-10-CM | POA: Diagnosis not present

## 2018-08-04 DIAGNOSIS — M84376A Stress fracture, unspecified foot, initial encounter for fracture: Secondary | ICD-10-CM | POA: Diagnosis not present

## 2018-08-04 DIAGNOSIS — M659 Synovitis and tenosynovitis, unspecified: Secondary | ICD-10-CM | POA: Diagnosis not present

## 2018-08-06 DIAGNOSIS — Z0000 Encounter for general adult medical examination without abnormal findings: Secondary | ICD-10-CM | POA: Diagnosis not present

## 2018-08-06 DIAGNOSIS — E7849 Other hyperlipidemia: Secondary | ICD-10-CM | POA: Diagnosis not present

## 2018-08-06 DIAGNOSIS — G894 Chronic pain syndrome: Secondary | ICD-10-CM | POA: Diagnosis not present

## 2018-08-06 DIAGNOSIS — F331 Major depressive disorder, recurrent, moderate: Secondary | ICD-10-CM | POA: Diagnosis not present

## 2018-08-06 DIAGNOSIS — E1169 Type 2 diabetes mellitus with other specified complication: Secondary | ICD-10-CM | POA: Diagnosis not present

## 2018-08-06 DIAGNOSIS — I10 Essential (primary) hypertension: Secondary | ICD-10-CM | POA: Diagnosis not present

## 2018-08-06 DIAGNOSIS — E1142 Type 2 diabetes mellitus with diabetic polyneuropathy: Secondary | ICD-10-CM | POA: Diagnosis not present

## 2018-08-06 DIAGNOSIS — K219 Gastro-esophageal reflux disease without esophagitis: Secondary | ICD-10-CM | POA: Diagnosis not present

## 2018-08-06 DIAGNOSIS — M797 Fibromyalgia: Secondary | ICD-10-CM | POA: Diagnosis not present

## 2018-08-27 DIAGNOSIS — D125 Benign neoplasm of sigmoid colon: Secondary | ICD-10-CM | POA: Diagnosis not present

## 2018-08-27 DIAGNOSIS — K635 Polyp of colon: Secondary | ICD-10-CM | POA: Diagnosis not present

## 2018-08-27 DIAGNOSIS — Z1211 Encounter for screening for malignant neoplasm of colon: Secondary | ICD-10-CM | POA: Diagnosis not present

## 2018-09-02 DIAGNOSIS — M659 Synovitis and tenosynovitis, unspecified: Secondary | ICD-10-CM | POA: Diagnosis not present

## 2018-09-02 DIAGNOSIS — M5090 Cervical disc disorder, unspecified, unspecified cervical region: Secondary | ICD-10-CM | POA: Diagnosis not present

## 2018-09-02 DIAGNOSIS — M84376A Stress fracture, unspecified foot, initial encounter for fracture: Secondary | ICD-10-CM | POA: Diagnosis not present

## 2018-10-03 DIAGNOSIS — M84376A Stress fracture, unspecified foot, initial encounter for fracture: Secondary | ICD-10-CM | POA: Diagnosis not present

## 2018-10-03 DIAGNOSIS — M5090 Cervical disc disorder, unspecified, unspecified cervical region: Secondary | ICD-10-CM | POA: Diagnosis not present

## 2018-10-03 DIAGNOSIS — M659 Synovitis and tenosynovitis, unspecified: Secondary | ICD-10-CM | POA: Diagnosis not present

## 2018-11-02 DIAGNOSIS — M5090 Cervical disc disorder, unspecified, unspecified cervical region: Secondary | ICD-10-CM | POA: Diagnosis not present

## 2018-11-02 DIAGNOSIS — M84376A Stress fracture, unspecified foot, initial encounter for fracture: Secondary | ICD-10-CM | POA: Diagnosis not present

## 2018-11-02 DIAGNOSIS — M659 Synovitis and tenosynovitis, unspecified: Secondary | ICD-10-CM | POA: Diagnosis not present

## 2018-11-03 DIAGNOSIS — G894 Chronic pain syndrome: Secondary | ICD-10-CM | POA: Diagnosis not present

## 2018-11-03 DIAGNOSIS — M797 Fibromyalgia: Secondary | ICD-10-CM | POA: Diagnosis not present

## 2018-11-03 DIAGNOSIS — F331 Major depressive disorder, recurrent, moderate: Secondary | ICD-10-CM | POA: Diagnosis not present

## 2018-12-03 DIAGNOSIS — M659 Synovitis and tenosynovitis, unspecified: Secondary | ICD-10-CM | POA: Diagnosis not present

## 2018-12-03 DIAGNOSIS — M84376A Stress fracture, unspecified foot, initial encounter for fracture: Secondary | ICD-10-CM | POA: Diagnosis not present

## 2018-12-03 DIAGNOSIS — M5090 Cervical disc disorder, unspecified, unspecified cervical region: Secondary | ICD-10-CM | POA: Diagnosis not present

## 2019-02-04 DIAGNOSIS — M7989 Other specified soft tissue disorders: Secondary | ICD-10-CM | POA: Insufficient documentation

## 2019-02-04 DIAGNOSIS — M7662 Achilles tendinitis, left leg: Secondary | ICD-10-CM | POA: Insufficient documentation

## 2019-02-04 DIAGNOSIS — M19071 Primary osteoarthritis, right ankle and foot: Secondary | ICD-10-CM | POA: Insufficient documentation

## 2019-02-04 DIAGNOSIS — M19079 Primary osteoarthritis, unspecified ankle and foot: Secondary | ICD-10-CM

## 2019-02-04 DIAGNOSIS — M19072 Primary osteoarthritis, left ankle and foot: Secondary | ICD-10-CM | POA: Insufficient documentation

## 2019-02-04 DIAGNOSIS — M7661 Achilles tendinitis, right leg: Secondary | ICD-10-CM

## 2019-02-04 HISTORY — DX: Other specified soft tissue disorders: M79.89

## 2019-02-04 HISTORY — DX: Achilles tendinitis, right leg: M76.61

## 2019-02-04 HISTORY — DX: Primary osteoarthritis, unspecified ankle and foot: M19.079

## 2019-02-04 HISTORY — DX: Achilles tendinitis, right leg: M76.62

## 2019-02-04 HISTORY — DX: Morbid (severe) obesity due to excess calories: E66.01

## 2019-03-18 DIAGNOSIS — M5416 Radiculopathy, lumbar region: Secondary | ICD-10-CM

## 2019-03-18 DIAGNOSIS — E1142 Type 2 diabetes mellitus with diabetic polyneuropathy: Secondary | ICD-10-CM

## 2019-03-18 HISTORY — DX: Type 2 diabetes mellitus with diabetic polyneuropathy: E11.42

## 2019-03-18 HISTORY — DX: Radiculopathy, lumbar region: M54.16

## 2019-07-01 ENCOUNTER — Encounter: Payer: Self-pay | Admitting: *Deleted

## 2019-07-05 ENCOUNTER — Other Ambulatory Visit: Payer: Self-pay

## 2019-07-05 ENCOUNTER — Encounter: Payer: Self-pay | Admitting: Cardiology

## 2019-07-05 ENCOUNTER — Ambulatory Visit (INDEPENDENT_AMBULATORY_CARE_PROVIDER_SITE_OTHER): Payer: Medicare HMO | Admitting: Cardiology

## 2019-07-05 VITALS — BP 110/74 | HR 91 | Ht 67.0 in | Wt 267.0 lb

## 2019-07-05 DIAGNOSIS — E782 Mixed hyperlipidemia: Secondary | ICD-10-CM

## 2019-07-05 DIAGNOSIS — R072 Precordial pain: Secondary | ICD-10-CM | POA: Diagnosis not present

## 2019-07-05 DIAGNOSIS — R002 Palpitations: Secondary | ICD-10-CM | POA: Diagnosis not present

## 2019-07-05 DIAGNOSIS — E119 Type 2 diabetes mellitus without complications: Secondary | ICD-10-CM

## 2019-07-05 DIAGNOSIS — I10 Essential (primary) hypertension: Secondary | ICD-10-CM | POA: Diagnosis not present

## 2019-07-05 DIAGNOSIS — Z01812 Encounter for preprocedural laboratory examination: Secondary | ICD-10-CM

## 2019-07-05 DIAGNOSIS — R0602 Shortness of breath: Secondary | ICD-10-CM

## 2019-07-05 MED ORDER — METOPROLOL TARTRATE 50 MG PO TABS: ORAL_TABLET | ORAL | 0 refills | Status: DC

## 2019-07-05 MED ORDER — FUROSEMIDE 20 MG PO TABS: 20.0000 mg | ORAL_TABLET | Freq: Every day | ORAL | 1 refills | Status: DC

## 2019-07-05 MED ORDER — POTASSIUM CHLORIDE CRYS ER 20 MEQ PO TBCR: 20.0000 meq | EXTENDED_RELEASE_TABLET | Freq: Every day | ORAL | 1 refills | Status: DC

## 2019-07-05 NOTE — Progress Notes (Signed)
Cardiology Office Note:    Date:  07/05/2019   ID:  Tonya Sanchez, DOB 21-Sep-1950, MRN [id number]  PCP:  Verdell Carmine., MD  Cardiologist:  Berniece Salines, DO  Electrophysiologist:  None   Referring MD: Verdell Carmine., MD   The patient was referred for shortness of breath.  History of Present Illness:    Tonya Sanchez is a 69 y.o. female with a hx of hypertension, Hyperlipidemia, Diabetes Mellitus, family history of premature coronary disease was seen today at the request of Dr. Ward Givens, MD for shortness of breath and palpitations.  The patient tells me today that she has been experiencing significant shortness of breath on exertion.  She notes that this has been going on for a while but most recently she can only walk about several feet and then started experience shortness of breath.  She says short after the shortness of breath she experiences significantly intermittent palpitations.  She notes that the palpitation lasted 3 seconds prior to resolution.  Her most bothersome symptoms is the shortness of breath.  She tells me that rest makes her symptoms better.  She denies any lightheadedness, dizziness.  Past Medical History:  Diagnosis Date  . Achilles tendonitis, bilateral 02/04/2019  . Anxiety    occasional - no current med.  . Arthritis    "everywhere"  . Bladder tumor 05/03/2018  . Cervical spondylosis with radiculopathy 12/23/2017  . Cervical stenosis of spinal canal 12/23/2017   Added automatically from request for surgery [phone]  . Chronic pain syndrome 05/03/2018  . De Quervain's disease (tenosynovitis) 10/13/2017  . Diabetic peripheral neuropathy ([place]) 03/18/2019  . Essential hypertension 12/28/2017  . Fibromyalgia 05/03/2018  . Gastroesophageal reflux disease 05/06/2018  . Hand arthritis 01/08/2017  . History of DVT (deep vein thrombosis) 1960s   no problems since  . History of recurrent UTIs 05/03/2018  . 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 Hypertension    under  control with meds., has been on med. x 3-4 yr.  . Leg swelling 02/04/2019  . Lumbar facet arthropathy 08/19/2017  . Lumbar radiculopathy 03/18/2019  . Mass of hand 10/2012   left  . Metatarsal stress fracture, left, initial encounter 11/01/2014  . Moderate episode of recurrent major depressive disorder ([place]) 05/03/2018  . Morbid obesity ([place]) 02/04/2019  . Other hyperlipidemia 05/03/2018  . Pain, joint, ankle and foot, unspecified laterality 11/01/2014  . Primary localized osteoarthrosis, ankle and foot 02/04/2019  . S/P cervical spinal fusion 04/02/2018  . Stenosing tenosynovitis of wrist 10/2012   left  . TIA (transient ischemic attack) 03/2010   no deficits  . Tremor, essential 10/21/2017  . Type 2 diabetes mellitus ([place]) 12/28/2017  . Wrist tendonitis 10/13/2017    Past Surgical History:  Procedure Laterality Date  . ABDOMINAL HYSTERECTOMY     partial  . ANTERIOR AND POSTERIOR VAGINAL REPAIR  04/07/2000   with vaginal vault suspension  . ANTERIOR CERVICAL DECOMP/DISCECTOMY FUSION  10/20/2000   C5-6  . CARPAL TUNNEL RELEASE Right 04/12/2003  . CARPAL TUNNEL RELEASE Left 05/25/2003  . CARPAL TUNNEL RELEASE Left 10/27/2012   Procedure: LEFT WRIST STENOSISNG TENOSYNOVITIS RELEASE;  Surgeon: Schuyler Amor, MD;  Location: [facility];  Service: Orthopedics;  Laterality: Left;  . CHOLECYSTECTOMY    . CLOSED MANIPULATION KNEE WITH STERIOD INJECTION Left 01/27/2000  . CYSTOSCOPY WITH BIOPSY  04/01/2011   bladder bx. x 4  . EXAMINATION UNDER ANESTHESIA Left 01/27/2000   ankle - with steroid injection  .  FOOT ARTHRODESIS, TRIPLE Bilateral   . FOOT GANGLION EXCISION Left 02/10/2002  . FOOT SURGERY Bilateral    spurs removed from both little toes  . KNEE ARTHROSCOPY Right   . PUBOVAGINAL SLING  04/07/2000  . TENDON RECONSTRUCTION Left    foot  . TOTAL KNEE ARTHROPLASTY Left 09/09/1999    Current Medications: Current Meds  Medication Sig  . amitriptyline (ELAVIL) 50 MG tablet  TAKE 1 TABLET BY MOUTH EVERY DAY AT NIGHT  . aspirin 325 MG tablet Take 325 mg by mouth daily.  Marland Kitchen atorvastatin (LIPITOR) 10 MG tablet Take 1 tablet by mouth daily.  . Cholecalciferol 125 MCG (5000 UT) TABS Take 5,000 Units by mouth daily.  . Dulaglutide (TRULICITY) 3 0000000 SOPN Inject 3 mg into the skin once a week.  . esomeprazole (NEXIUM) 40 MG capsule Take 40 mg by mouth daily before breakfast.  . glipiZIDE (GLUCOTROL XL) 5 MG 24 hr tablet Take 1 tablet (5 mg total) by mouth daily.  . metoprolol succinate (TOPROL-XL) 100 MG 24 hr tablet Take 100 mg by mouth daily. Take with or immediately following a meal.  . olmesartan (BENICAR) 20 MG tablet Take 1 tablet (20 mg total) by mouth every morning.  . venlafaxine XR (EFFEXOR-XR) 150 MG 24 hr capsule TAKE 1 CAPSULE BY MOUTH EVERY DAY     Allergies:   Keflex [cephalexin], Metformin hcl, and Percocet [oxycodone-acetaminophen]   Social History   Socioeconomic History  . Marital status: Married    Spouse name: Not on file  . Number of children: Not on file  . Years of education: Not on file  . Highest education level: Not on file  Occupational History  . Not on file  Tobacco Use  . Smoking status: Never Smoker  . Smokeless tobacco: Never Used  Substance and Sexual Activity  . Alcohol use: No  . Drug use: No  . Sexual activity: Not on file  Other Topics Concern  . Not on file  Social History Narrative  . Not on file   Social Determinants of Health   Financial Resource Strain:   . Difficulty of Paying Living Expenses: Not on file  Food Insecurity:   . Worried About Charity fundraiser in the Last Year: Not on file  . Ran Out of Food in the Last Year: Not on file  Transportation Needs:   . Lack of Transportation (Medical): Not on file  . Lack of Transportation (Non-Medical): Not on file  Physical Activity:   . Days of Exercise per Week: Not on file  . Minutes of Exercise per Session: Not on file  Stress:   . Feeling of  Stress : Not on file  Social Connections:   . Frequency of Communication with Friends and Family: Not on file  . Frequency of Social Gatherings with Friends and Family: Not on file  . Attends Religious Services: Not on file  . Active Member of Clubs or Organizations: Not on file  . Attends Archivist Meetings: Not on file  . Marital Status: Not on file     Family History: The patient's family history includes Breast cancer in her mother; COPD in her father; Diabetes in her mother; Heart disease in her brother and father; Sjogren's syndrome in her sister; Stroke in her brother, father, and sister.  ROS:   Review of Systems  Constitution: Negative for decreased appetite, fever and weight gain.  HENT: Negative for congestion, ear discharge, hoarse voice and sore throat.  Eyes: Negative for discharge, redness, vision loss in right eye and visual halos.  Cardiovascular: Reports dyspnea on exertion and palpitations.  Negative for dyspnea on exertion, leg swelling, orthopnea and palpitations.  Respiratory: Negative for cough, hemoptysis, shortness of breath and snoring.   Endocrine: Negative for heat intolerance and polyphagia.  Hematologic/Lymphatic: Negative for bleeding problem. Does not bruise/bleed easily.  Skin: Negative for flushing, nail changes, rash and suspicious lesions.  Musculoskeletal: Negative for arthritis, joint pain, muscle cramps, myalgias, neck pain and stiffness.  Gastrointestinal: Negative for abdominal pain, bowel incontinence, diarrhea and excessive appetite.  Genitourinary: Negative for decreased libido, genital sores and incomplete emptying.  Neurological: Negative for brief paralysis, focal weakness, headaches and loss of balance.  Psychiatric/Behavioral: Negative for altered mental status, depression and suicidal ideas.  Allergic/Immunologic: Negative for HIV exposure and persistent infections.    EKGs/Labs/Other Studies Reviewed:    The following  studies were reviewed today:   EKG:  The ekg ordered today demonstrates sinus rhythm, heart rate 91 bpm  Recent Labs:   CBC: WBC 7.1, hemoglobin 12.7, hematocrit 30.9, platelet 219 Chemistry: Sodium 139, potassium 4.8, chloride 103, bicarb 29, BUN 17, glucose 157, creatinine 0.89, calcium 9.2 Hemoglobin A1c 7.2  Recent Lipid Panel    Component Value Date/Time   CHOL  04/01/2010 0910    151 (NOTE) ATP III Classification:      < 200        mg/dL        Desirable     200 - 239     mg/dL        Borderline High     >= 240        mg/dL        High    TRIG 152 (H) 04/01/2010 0910   HDL 40 04/01/2010 0910   CHOLHDL 3.8 04/01/2010 0910   VLDL 30 04/01/2010 0910   LDLCALC  04/01/2010 0910    81 (NOTE)  Total Cholesterol/HDL Ratio:CHD Risk                       Coronary Heart Disease Risk Table                                       Men       Women         1/2 Average Risk              3.4        3.3             Average Risk              5.0         4.4         2 X Average Risk              9.6        7.1         3 X Average Risk             23.4       11.0 Use the calculated Patient Ratio above and the CHD Risk table  to determine the patient's CHD Risk. ATP III Classification (LDL):      < 100         mg/dL         Optimal  100 - 129     mg/dL         Near or Above Optimal     130 - 159     mg/dL         Borderline High     160 - 189     mg/dL         High      > 190        mg/dL         Very High     Physical Exam:    VS:  BP 110/74   Pulse 91   Ht 5\' 7"  (1.702 m)   Wt 267 lb (121.1 kg)   SpO2 96%   BMI 41.82 kg/m     Wt Readings from Last 3 Encounters:  07/05/19 267 lb (121.1 kg)  10/27/12 251 lb (113.9 kg)     GEN: Well nourished, well developed in no acute distress HEENT: Normal NECK: No JVD; No carotid bruits LYMPHATICS: No lymphadenopathy CARDIAC: S1S2 noted,RRR, no murmurs, rubs, gallops RESPIRATORY:  Clear to auscultation without rales, wheezing or rhonchi  ABDOMEN:  Soft, non-tender, non-distended, +bowel sounds, no guarding. EXTREMITIES: Bilateral +1 edema, No cyanosis, no clubbing MUSCULOSKELETAL:; No deformity  SKIN: Warm and dry NEUROLOGIC:  Alert and oriented x 3, non-focal PSYCHIATRIC:  Normal affect, good insight  ASSESSMENT:    1. Palpitations   2. Precordial pain   3. Essential hypertension   4. Pre-procedure lab exam   5. Mixed hyperlipidemia   6. Shortness of breath   7. Type 2 diabetes mellitus without complication, without long-term current use of insulin (HCC)    PLAN:    1.  I am concerned that her symptoms may be her anginal equivalent given her risk factors (premature family history of coronary disease, diabetes, hypertension, hyperlipidemia) therefore like to pursue ischemic evaluation.  The patient does not have any IV contrast dye allergy therefore a CTA coronaries will be appropriate at this time.  Educated patient on the test and she is willing to proceed.  2.  I would like to rule out a cardiovascular etiology of this palpitation, therefore at this time I would like to placed a zio patch for   7 days. In additon a transthoracic echocardiogram will be ordered to assess LV/RV function and any structural abnormalities. Once these testing have been performed amd reviewed further reccomendations will be made. For now, I do reccomend that the patient goes to the nearest ED if  symptoms recur.  3.  Given her physical exam of bilateral leg edema I have ordered Lasix 20 mg daily as well as potassium supplement, should get blood work in 1 month.  4.  Hyperlipidemia- continue on her current dose of statin.  The patient is in agreement with the above plan. The patient left the office in stable condition.  The patient will follow up in 1 month or sooner if needed   Medication Adjustments/Labs and Tests Ordered: Current medicines are reviewed at length with the patient today.  Concerns regarding medicines are outlined above.  Orders  Placed This Encounter  Procedures  . CT CORONARY FRACTIONAL FLOW RESERVE DATA PREP  . CT CORONARY FRACTIONAL FLOW RESERVE FLUID ANALYSIS  . CT CORONARY MORPH W/CTA COR W/SCORE W/CA W/CM &/OR WO/CM  . Basic Metabolic Panel (BMET)  . Basic Metabolic Panel (BMET)  . Magnesium  . Lipid Profile  . LONG TERM MONITOR (3-14 DAYS)  . EKG 12-Lead  . ECHOCARDIOGRAM COMPLETE  Meds ordered this encounter  Medications  . furosemide (LASIX) 20 MG tablet    Sig: Take 1 tablet (20 mg total) by mouth daily.    Dispense:  90 tablet    Refill:  1  . potassium chloride SA (KLOR-CON M20) 20 MEQ tablet    Sig: Take 1 tablet (20 mEq total) by mouth daily.    Dispense:  90 tablet    Refill:  1    Patient Instructions  Medication Instructions:  Your physician has recommended you make the following change in your medication:   START: Furosemide 20 mg Take 1 tab daily START: Potassium 20 meq Take 1 tab daily  *If you need a refill on your cardiac medications before your next appointment, please call your pharmacy*  Lab Work: Your physician recommends that you return for lab work in: 1 month BMP,Magnesium,LIPID  3-7 days prior to CT: BMP  If you have labs (blood work) drawn today and your tests are completely normal, you will receive your results only by: Marland Kitchen MyChart Message (if you have MyChart) OR . A paper copy in the mail If you have any lab test that is abnormal or we need to change your treatment, we will call you to review the results.  Testing/Procedures: Your physician has requested that you have an echocardiogram. Echocardiography is a painless test that uses sound waves to create images of your heart. It provides your doctor with information about the size and shape of your heart and how well your heart's chambers and valves are working. This procedure takes approximately one hour. There are no restrictions for this procedure.  A zio monitor was ordered today. It will remain on for 7  days. You will then return monitor and event diary in provided box. It takes 1-2 weeks for report to be downloaded and returned to [place]. We will call you with the results. If monitor falls off or has orange flashing light, please call Zio for further instructions.   Your physician has requested that you have cardiac CT. Cardiac computed tomography (CT) is a painless test that uses an x-ray machine to take clear, detailed pictures of your heart. For further information please visit [email or web address][employer][email or web address]. Please follow instruction sheet as given.  Your cardiac CT will be scheduled at one of the below locations:   [facility] [address] [place], [place] [address] [phone]  If scheduled at [facility], please arrive at the [facility] main entrance of [facility] 30-45 minutes prior to test start time. Proceed to the [facility] Radiology Department (first floor) to check-in and test prep.  Please follow these instructions carefully (unless otherwise directed):  On the Night Before the Test: . Be sure to Drink plenty of water. . Do not consume any caffeinated/decaffeinated beverages or chocolate 12 hours prior to your test. . Do not take any antihistamines 12 hours prior to your test.  On the Day of the Test: . Drink plenty of water. Do not drink any water within one hour of the test. . Do not eat any food 4 hours prior to the test. . You may take your regular medications prior to the test.  . Take metoprolol (TOPROL) two hours prior to test. . HOLD Furosemide/ morning of the test. . FEMALES- please wear underwire-free bra if available   *For Clinical Staff only. Please instruct patient the following:*        -Drink plenty of water       -  Hold Furosemide/hydrochlorothiazide morning of the test       -Take metoprolol (Lopressor) 2 hours prior to test (if applicable).                  -If HR is less than 55 BPM- No Beta Blocker(METOPROLOL SUCINATE)                 -IF HR is greater than 55 BPM and patient is less than or equal to 79 yrs old TOPROL(METOPROLOL SUCINATE) 100mg  x1.            After the Test: . Drink plenty of water. . After receiving IV contrast, you may experience a mild flushed feeling. This is normal. . On occasion, you may experience a mild rash up to 24 hours after the test. This is not dangerous. If this occurs, you can take Benadryl 25 mg and increase your fluid intake. . If you experience trouble breathing, this can be serious. If it is severe call 911 IMMEDIATELY. If it is mild, please call our office.   Once we have confirmed authorization from your insurance company, we will call you to set up a date and time for your test.   For non-scheduling related questions, please contact the cardiac imaging nurse navigator should you have any questions/concerns: Marchia Bond, RN Navigator Cardiac Imaging [facility] [phone] Office    Follow-Up: At [facility], you and your health needs are our priority.  As part of our continuing mission to provide you with exceptional heart care, we have created designated Provider Care Teams.  These Care Teams include your primary Cardiologist (physician) and Advanced Practice Providers (APPs -  Physician Assistants and Nurse Practitioners) who all work together to provide you with the care you need, when you need it.  Your next appointment:   1 month(s)  The format for your next appointment:   In Person  Provider:   Berniece Salines, DO  Other Instructions  Cardiac CT Angiogram A cardiac CT angiogram is a procedure to look at the heart and the area around the heart. It may be done to help find the cause of chest pains or other symptoms of heart disease. During this procedure, a substance called contrast dye is injected into the blood vessels in the area to be checked. A large X-ray machine, called a CT scanner, then takes detailed pictures of the heart  and the surrounding area. The procedure is also sometimes called a coronary CT angiogram, coronary artery scanning, or CTA. A cardiac CT angiogram allows the health care provider to see how well blood is flowing to and from the heart. The health care provider will be able to see if there are any problems, such as:  Blockage or narrowing of the coronary arteries in the heart.  Fluid around the heart.  Signs of weakness or disease in the muscles, valves, and tissues of the heart. Tell a health care provider about:  Any allergies you have. This is especially important if you have had a previous allergic reaction to contrast dye.  All medicines you are taking, including vitamins, herbs, eye drops, creams, and over-the-counter medicines.  Any blood disorders you have.  Any surgeries you have had.  Any medical conditions you have.  Whether you are pregnant or may be pregnant.  Any anxiety disorders, chronic pain, or other conditions you have that may increase your stress or prevent you from lying still. What are the risks? Generally, this is  a safe procedure. However, problems may occur, including:  Bleeding.  Infection.  Allergic reactions to medicines or dyes.  Damage to other structures or organs.  Kidney damage from the contrast dye that is used.  Increased risk of cancer from radiation exposure. This risk is low. Talk with your health care provider about: ? The risks and benefits of testing. ? How you can receive the lowest dose of radiation. What happens before the procedure?  Wear comfortable clothing and remove any jewelry, glasses, dentures, and hearing aids.  Follow instructions from your health care provider about eating and drinking. This may include: ? For 12 hours before the procedure -- avoid caffeine. This includes tea, coffee, soda, energy drinks, and diet pills. Drink plenty of water or other fluids that do not have caffeine in them. Being well hydrated can  prevent complications. ? For 4-6 hours before the procedure -- stop eating and drinking. The contrast dye can cause nausea, but this is less likely if your stomach is empty.  Ask your health care provider about changing or stopping your regular medicines. This is especially important if you are taking diabetes medicines, blood thinners, or medicines to treat problems with erections (erectile dysfunction). What happens during the procedure?   Hair on your chest may need to be removed so that small sticky patches called electrodes can be placed on your chest. These will transmit information that helps to monitor your heart during the procedure.  An IV will be inserted into one of your veins.  You might be given a medicine to control your heart rate during the procedure. This will help to ensure that good images are obtained.  You will be asked to lie on an exam table. This table will slide in and out of the CT machine during the procedure.  Contrast dye will be injected into the IV. You might feel warm, or you may get a metallic taste in your mouth.  You will be given a medicine called nitroglycerin. This will relax or dilate the arteries in your heart.  The table that you are lying on will move into the CT machine tunnel for the scan.  The person running the machine will give you instructions while the scans are being done. You may be asked to: ? Keep your arms above your head. ? Hold your breath. ? Stay very still, even if the table is moving.  When the scanning is complete, you will be moved out of the machine.  The IV will be removed. The procedure may vary among health care providers and hospitals. What can I expect after the procedure? After your procedure, it is common to have:  A metallic taste in your mouth from the contrast dye.  A feeling of warmth.  A headache from the nitroglycerin. Follow these instructions at home:  Take over-the-counter and prescription medicines  only as told by your health care provider.  If you are told, drink enough fluid to keep your urine pale yellow. This will help to flush the contrast dye out of your body.  Most people can return to their normal activities right after the procedure. Ask your health care provider what activities are safe for you.  It is up to you to get the results of your procedure. Ask your health care provider, or the department that is doing the procedure, when your results will be ready.  Keep all follow-up visits as told by your health care provider. This is important. Contact a health  care provider if:  You have any symptoms of allergy to the contrast dye. These include: ? Shortness of breath. ? Rash or hives. ? A racing heartbeat. Summary  A cardiac CT angiogram is a procedure to look at the heart and the area around the heart. It may be done to help find the cause of chest pains or other symptoms of heart disease.  During this procedure, a large X-ray machine, called a CT scanner, takes detailed pictures of the heart and the surrounding area after a contrast dye has been injected into blood vessels in the area.  Ask your health care provider about changing or stopping your regular medicines before the procedure. This is especially important if you are taking diabetes medicines, blood thinners, or medicines to treat erectile dysfunction.  If you are told, drink enough fluid to keep your urine pale yellow. This will help to flush the contrast dye out of your body. This information is not intended to replace advice given to you by your health care provider. Make sure you discuss any questions you have with your health care provider. Document Revised: 02/02/2019 Document Reviewed: 02/02/2019 Elsevier Patient Education  [place].  Echocardiogram An echocardiogram is a procedure that uses painless sound waves (ultrasound) to produce an image of the heart. Images from an echocardiogram can  provide important information about:  Signs of coronary artery disease (CAD).  Aneurysm detection. An aneurysm is a weak or damaged part of an artery wall that bulges out from the normal force of blood pumping through the body.  Heart size and shape. Changes in the size or shape of the heart can be associated with certain conditions, including heart failure, aneurysm, and CAD.  Heart muscle function.  Heart valve function.  Signs of a past heart attack.  Fluid buildup around the heart.  Thickening of the heart muscle.  A tumor or infectious growth around the heart valves. Tell a health care provider about:  Any allergies you have.  All medicines you are taking, including vitamins, herbs, eye drops, creams, and over-the-counter medicines.  Any blood disorders you have.  Any surgeries you have had.  Any medical conditions you have.  Whether you are pregnant or may be pregnant. What are the risks? Generally, this is a safe procedure. However, problems may occur, including:  Allergic reaction to dye (contrast) that may be used during the procedure. What happens before the procedure? No specific preparation is needed. You may eat and drink normally. What happens during the procedure?   An IV tube may be inserted into one of your veins.  You may receive contrast through this tube. A contrast is an injection that improves the quality of the pictures from your heart.  A gel will be applied to your chest.  A wand-like tool (transducer) will be moved over your chest. The gel will help to transmit the sound waves from the transducer.  The sound waves will harmlessly bounce off of your heart to allow the heart images to be captured in real-time motion. The images will be recorded on a computer. The procedure may vary among health care providers and hospitals. What happens after the procedure?  You may return to your normal, everyday life, including diet, activities, and  medicines, unless your health care provider tells you not to do that. Summary  An echocardiogram is a procedure that uses painless sound waves (ultrasound) to produce an image of the heart.  Images from an echocardiogram can provide important  information about the size and shape of your heart, heart muscle function, heart valve function, and fluid buildup around your heart.  You do not need to do anything to prepare before this procedure. You may eat and drink normally.  After the echocardiogram is completed, you may return to your normal, everyday life, unless your health care provider tells you not to do that. This information is not intended to replace advice given to you by your health care provider. Make sure you discuss any questions you have with your health care provider. Document Revised: 09/30/2018 Document Reviewed: 07/12/2016 Elsevier Patient Education  [place].      Adopting a Healthy Lifestyle.  Know what a healthy weight is for you (roughly BMI <25) and aim to maintain this   Aim for 7+ servings of fruits and vegetables daily   65-80+ fluid ounces of water or unsweet tea for healthy kidneys   Limit to max 1 drink of alcohol per day; avoid smoking/tobacco   Limit animal fats in diet for cholesterol and heart health - choose grass fed whenever available   Avoid highly processed foods, and foods high in saturated/trans fats   Aim for low stress - take time to unwind and care for your mental health   Aim for 150 min of moderate intensity exercise weekly for heart health, and weights twice weekly for bone health   Aim for 7-9 hours of sleep daily   When it comes to diets, agreement about the perfect plan isnt easy to find, even among the experts. Experts at the [employer] developed an idea known as the Healthy Eating Plate. Just imagine a plate divided into logical, healthy portions.   The emphasis is on diet quality:   Load up on  vegetables and fruits - one-half of your plate: Aim for color and variety, and remember that potatoes dont count.   Go for whole grains - one-quarter of your plate: Whole wheat, barley, wheat berries, quinoa, oats, brown rice, and foods made with them. If you want pasta, go with whole wheat pasta.   Protein power - one-quarter of your plate: Fish, chicken, beans, and nuts are all healthy, versatile protein sources. Limit red meat.   The diet, however, does go beyond the plate, offering a few other suggestions.   Use healthy plant oils, such as olive, canola, soy, corn, sunflower and peanut. Check the labels, and avoid partially hydrogenated oil, which have unhealthy trans fats.   If youre thirsty, drink water. Coffee and tea are good in moderation, but skip sugary drinks and limit milk and dairy products to one or two daily servings.   The type of carbohydrate in the diet is more important than the amount. Some sources of carbohydrates, such as vegetables, fruits, whole grains, and beans-are healthier than others.   Finally, stay active  Signed, Berniece Salines, DO  07/05/2019 4:21 PM    [facility]

## 2019-07-05 NOTE — Patient Instructions (Addendum)
Medication Instructions:  Your physician has recommended you make the following change in your medication:   START: Furosemide 20 mg Take 1 tab daily START: Potassium 20 meq Take 1 tab daily  *If you need a refill on your cardiac medications before your next appointment, please call your pharmacy*  Lab Work: Your physician recommends that you return for lab work in: 1 month BMP,Magnesium,LIPID  3-7 days prior to CT: BMP  If you have labs (blood work) drawn today and your tests are completely normal, you will receive your results only by: Marland Kitchen MyChart Message (if you have MyChart) OR . A paper copy in the mail If you have any lab test that is abnormal or we need to change your treatment, we will call you to review the results.  Testing/Procedures: Your physician has requested that you have an echocardiogram. Echocardiography is a painless test that uses sound waves to create images of your heart. It provides your doctor with information about the size and shape of your heart and how well your heart's chambers and valves are working. This procedure takes approximately one hour. There are no restrictions for this procedure.  A zio monitor was ordered today. It will remain on for 7 days. You will then return monitor and event diary in provided box. It takes 1-2 weeks for report to be downloaded and returned to [place]. We will call you with the results. If monitor falls off or has orange flashing light, please call [employer] for further instructions.   Your physician has requested that you have cardiac CT. Cardiac computed tomography (CT) is a painless test that uses an x-ray machine to take clear, detailed pictures of your heart. For further information please visit [email or web address]. Please follow instruction sheet as given.  Your cardiac CT will be scheduled at one of the below locations:   [facility] [address] [place], [place] [address] [phone]  If scheduled at [facility], please arrive at the [facility] main entrance of [facility] 30-45 minutes prior to test start time. Proceed to the [facility] Radiology Department (first floor) to check-in and test prep.  Please follow these instructions carefully (unless otherwise directed):  On the Night Before the Test: . Be sure to Drink plenty of water. . Do not consume any caffeinated/decaffeinated beverages or chocolate 12 hours prior to your test. . Do not take any antihistamines 12 hours prior to your test.  On the Day of the Test: . Drink plenty of water. Do not drink any water within one hour of the test. . Do not eat any food 4 hours prior to the test. . You may take your regular medications prior to the test.  . Take metoprolol (lopressor) two hours prior to test. . HOLD Furosemide/ morning of the test. . FEMALES- please wear underwire-free bra if available                  -If HR is less than 55 BPM- No Beta Blocker(METOPROLOL)                -IF HR is greater than 55 BPM and patient is less than or equal to 84 yrs old Lopressor(metoprolol)) 100mg  x1.            After the Test: . Drink plenty of water. . After receiving IV contrast, you may experience a mild flushed feeling. This is normal. . On occasion, you may experience a mild rash up to  24 hours after the test. This is not dangerous. If this occurs, you can take Benadryl 25 mg and increase your fluid intake. . If you experience trouble breathing, this can be serious. If it is severe call 911 IMMEDIATELY. If it is mild, please call our office.   Once we have confirmed authorization from your insurance company, we will call you to set up a date and time for your test.   For non-scheduling related questions, please contact the cardiac imaging nurse navigator should you have any questions/concerns: Marchia Bond, RN Navigator Cardiac Imaging [facility] [phone] Office    Follow-Up: At [facility], you and your health needs are our priority.  As part of our continuing mission to provide you with exceptional heart care, we have created designated Provider Care Teams.  These Care Teams include your primary Cardiologist (physician) and Advanced Practice Providers (APPs -  Physician Assistants and Nurse Practitioners) who all work together to provide you with the care you need, when you need it.  Your next appointment:   1 month(s)  The format for your next appointment:   In Person  Provider:   Berniece Salines, DO  Other Instructions  Cardiac CT Angiogram A cardiac CT angiogram is a procedure to look at the heart and the area around the heart. It may be done to help find the cause of chest pains or other symptoms of heart disease. During this procedure, a substance called contrast dye is injected into the blood vessels in the area to be checked. A large X-ray machine, called a CT scanner, then takes detailed pictures of the heart and the surrounding area. The procedure is also sometimes called a coronary CT angiogram, coronary artery scanning, or CTA. A cardiac CT angiogram allows the health care provider to see how well blood is flowing to and from the heart. The health care provider will be able to see if there are any problems, such as:  Blockage or narrowing of the coronary arteries in the heart.  Fluid around the heart.  Signs of weakness or disease in the muscles, valves, and tissues of the heart. Tell a health care provider about:  Any allergies you have. This is especially important if you have had a previous allergic reaction to contrast dye.  All medicines you are taking, including vitamins, herbs, eye drops, creams, and over-the-counter medicines.  Any blood disorders you have.  Any surgeries you have had.  Any medical conditions you have.  Whether you are pregnant or may be pregnant.  Any anxiety disorders, chronic pain, or other conditions you have that may  increase your stress or prevent you from lying still. What are the risks? Generally, this is a safe procedure. However, problems may occur, including:  Bleeding.  Infection.  Allergic reactions to medicines or dyes.  Damage to other structures or organs.  Kidney damage from the contrast dye that is used.  Increased risk of cancer from radiation exposure. This risk is low. Talk with your health care provider about: ? The risks and benefits of testing. ? How you can receive the lowest dose of radiation. What happens before the procedure?  Wear comfortable clothing and remove any jewelry, glasses, dentures, and hearing aids.  Follow instructions from your health care provider about eating and drinking. This may include: ? For 12 hours before the procedure -- avoid caffeine. This includes tea, coffee, soda, energy drinks, and diet pills. Drink plenty of water or other fluids  that do not have caffeine in them. Being well hydrated can prevent complications. ? For 4-6 hours before the procedure -- stop eating and drinking. The contrast dye can cause nausea, but this is less likely if your stomach is empty.  Ask your health care provider about changing or stopping your regular medicines. This is especially important if you are taking diabetes medicines, blood thinners, or medicines to treat problems with erections (erectile dysfunction). What happens during the procedure?   Hair on your chest may need to be removed so that small sticky patches called electrodes can be placed on your chest. These will transmit information that helps to monitor your heart during the procedure.  An IV will be inserted into one of your veins.  You might be given a medicine to control your heart rate during the procedure. This will help to ensure that good images are obtained.  You will be asked to lie on an exam table. This table will slide in and out of the CT machine during the procedure.  Contrast dye  will be injected into the IV. You might feel warm, or you may get a metallic taste in your mouth.  You will be given a medicine called nitroglycerin. This will relax or dilate the arteries in your heart.  The table that you are lying on will move into the CT machine tunnel for the scan.  The person running the machine will give you instructions while the scans are being done. You may be asked to: ? Keep your arms above your head. ? Hold your breath. ? Stay very still, even if the table is moving.  When the scanning is complete, you will be moved out of the machine.  The IV will be removed. The procedure may vary among health care providers and hospitals. What can I expect after the procedure? After your procedure, it is common to have:  A metallic taste in your mouth from the contrast dye.  A feeling of warmth.  A headache from the nitroglycerin. Follow these instructions at home:  Take over-the-counter and prescription medicines only as told by your health care provider.  If you are told, drink enough fluid to keep your urine pale yellow. This will help to flush the contrast dye out of your body.  Most people can return to their normal activities right after the procedure. Ask your health care provider what activities are safe for you.  It is up to you to get the results of your procedure. Ask your health care provider, or the department that is doing the procedure, when your results will be ready.  Keep all follow-up visits as told by your health care provider. This is important. Contact a health care provider if:  You have any symptoms of allergy to the contrast dye. These include: ? Shortness of breath. ? Rash or hives. ? A racing heartbeat. Summary  A cardiac CT angiogram is a procedure to look at the heart and the area around the heart. It may be done to help find the cause of chest pains or other symptoms of heart disease.  During this procedure, a large X-ray  machine, called a CT scanner, takes detailed pictures of the heart and the surrounding area after a contrast dye has been injected into blood vessels in the area.  Ask your health care provider about changing or stopping your regular medicines before the procedure. This is especially important if you are taking diabetes medicines, blood thinners, or medicines to  treat erectile dysfunction.  If you are told, drink enough fluid to keep your urine pale yellow. This will help to flush the contrast dye out of your body. This information is not intended to replace advice given to you by your health care provider. Make sure you discuss any questions you have with your health care provider. Document Revised: 02/02/2019 Document Reviewed: 02/02/2019 Elsevier Patient Education  [place].  Echocardiogram An echocardiogram is a procedure that uses painless sound waves (ultrasound) to produce an image of the heart. Images from an echocardiogram can provide important information about:  Signs of coronary artery disease (CAD).  Aneurysm detection. An aneurysm is a weak or damaged part of an artery wall that bulges out from the normal force of blood pumping through the body.  Heart size and shape. Changes in the size or shape of the heart can be associated with certain conditions, including heart failure, aneurysm, and CAD.  Heart muscle function.  Heart valve function.  Signs of a past heart attack.  Fluid buildup around the heart.  Thickening of the heart muscle.  A tumor or infectious growth around the heart valves. Tell a health care provider about:  Any allergies you have.  All medicines you are taking, including vitamins, herbs, eye drops, creams, and over-the-counter medicines.  Any blood disorders you have.  Any surgeries you have had.  Any medical conditions you have.  Whether you are pregnant or may be pregnant. What are the risks? Generally, this is a safe procedure.  However, problems may occur, including:  Allergic reaction to dye (contrast) that may be used during the procedure. What happens before the procedure? No specific preparation is needed. You may eat and drink normally. What happens during the procedure?   An IV tube may be inserted into one of your veins.  You may receive contrast through this tube. A contrast is an injection that improves the quality of the pictures from your heart.  A gel will be applied to your chest.  A wand-like tool (transducer) will be moved over your chest. The gel will help to transmit the sound waves from the transducer.  The sound waves will harmlessly bounce off of your heart to allow the heart images to be captured in real-time motion. The images will be recorded on a computer. The procedure may vary among health care providers and hospitals. What happens after the procedure?  You may return to your normal, everyday life, including diet, activities, and medicines, unless your health care provider tells you not to do that. Summary  An echocardiogram is a procedure that uses painless sound waves (ultrasound) to produce an image of the heart.  Images from an echocardiogram can provide important information about the size and shape of your heart, heart muscle function, heart valve function, and fluid buildup around your heart.  You do not need to do anything to prepare before this procedure. You may eat and drink normally.  After the echocardiogram is completed, you may return to your normal, everyday life, unless your health care provider tells you not to do that. This information is not intended to replace advice given to you by your health care provider. Make sure you discuss any questions you have with your health care provider. Document Revised: 09/30/2018 Document Reviewed: 07/12/2016 Elsevier Patient Education  [place].

## 2019-07-07 ENCOUNTER — Ambulatory Visit (INDEPENDENT_AMBULATORY_CARE_PROVIDER_SITE_OTHER): Payer: Medicare HMO

## 2019-07-07 DIAGNOSIS — R002 Palpitations: Secondary | ICD-10-CM

## 2019-07-08 DIAGNOSIS — M1812 Unilateral primary osteoarthritis of first carpometacarpal joint, left hand: Secondary | ICD-10-CM | POA: Insufficient documentation

## 2019-07-08 HISTORY — DX: Unilateral primary osteoarthritis of first carpometacarpal joint, left hand: M18.12

## 2019-08-02 LAB — LIPID PANEL
Chol/HDL Ratio: 2.9 ratio (ref 0.0–4.4)
Cholesterol, Total: 109 mg/dL (ref 100–199)
HDL: 38 mg/dL — ABNORMAL LOW (ref >39)
LDL Chol Calc (NIH): 47 mg/dL (ref 0–99)
Triglycerides: 135 mg/dL (ref 0–149)
VLDL Cholesterol Cal: 24 mg/dL (ref 5–40)

## 2019-08-02 LAB — BASIC METABOLIC PANEL
BUN/Creatinine Ratio: 16 (ref 12–28)
BUN: 14 mg/dL (ref 8–27)
CO2: 26 mmol/L (ref 20–29)
Calcium: 9.8 mg/dL (ref 8.7–10.3)
Chloride: 102 mmol/L (ref 96–106)
Creatinine, Ser: 0.87 mg/dL (ref 0.57–1.00)
GFR calc Af Amer: 79 mL/min/{1.73_m2} (ref >59)
GFR calc non Af Amer: 68 mL/min/{1.73_m2} (ref >59)
Glucose: 78 mg/dL (ref 65–99)
Potassium: 4.8 mmol/L (ref 3.5–5.2)
Sodium: 143 mmol/L (ref 134–144)

## 2019-08-02 LAB — MAGNESIUM: Magnesium: 1.9 mg/dL (ref 1.6–2.3)

## 2019-08-05 ENCOUNTER — Ambulatory Visit (INDEPENDENT_AMBULATORY_CARE_PROVIDER_SITE_OTHER): Payer: Medicare HMO | Admitting: Cardiology

## 2019-08-05 ENCOUNTER — Other Ambulatory Visit: Payer: Self-pay

## 2019-08-05 ENCOUNTER — Encounter: Payer: Self-pay | Admitting: Cardiology

## 2019-08-05 VITALS — BP 140/90 | HR 85 | Ht 67.0 in | Wt 269.0 lb

## 2019-08-05 DIAGNOSIS — I10 Essential (primary) hypertension: Secondary | ICD-10-CM | POA: Diagnosis not present

## 2019-08-05 DIAGNOSIS — I472 Ventricular tachycardia: Secondary | ICD-10-CM

## 2019-08-05 DIAGNOSIS — I4729 Other ventricular tachycardia: Secondary | ICD-10-CM

## 2019-08-05 DIAGNOSIS — E669 Obesity, unspecified: Secondary | ICD-10-CM

## 2019-08-05 DIAGNOSIS — I471 Supraventricular tachycardia: Secondary | ICD-10-CM

## 2019-08-05 DIAGNOSIS — I4719 Other supraventricular tachycardia: Secondary | ICD-10-CM

## 2019-08-05 HISTORY — DX: Other supraventricular tachycardia: I47.19

## 2019-08-05 HISTORY — DX: Other ventricular tachycardia: I47.29

## 2019-08-05 HISTORY — DX: Ventricular tachycardia: I47.2

## 2019-08-05 HISTORY — DX: Supraventricular tachycardia: I47.1

## 2019-08-05 MED ORDER — METOPROLOL SUCCINATE ER 25 MG PO TB24: 12.5000 mg | ORAL_TABLET | Freq: Every day | ORAL | 1 refills | Status: DC

## 2019-08-05 NOTE — Patient Instructions (Signed)
Medication Instructions:  Your physician has recommended you make the following change in your medication:   START: Toprol XL 25 mg Take 1/2 tab (12.5 mg) daily  *If you need a refill on your cardiac medications before your next appointment, please call your pharmacy*  Lab Work: NOne If you have labs (blood work) drawn today and your tests are completely normal, you will receive your results only by: Marland Kitchen MyChart Message (if you have MyChart) OR . A paper copy in the mail If you have any lab test that is abnormal or we need to change your treatment, we will call you to review the results.  Testing/Procedures: None  Follow-Up: At [facility], you and your health needs are our priority.  As part of our continuing mission to provide you with exceptional heart care, we have created designated Provider Care Teams.  These Care Teams include your primary Cardiologist (physician) and Advanced Practice Providers (APPs -  Physician Assistants and Nurse Practitioners) who all work together to provide you with the care you need, when you need it.  Your next appointment:   3 month(s)  The format for your next appointment:   In Person  Provider:   Berniece Salines, DO  Other Instructions

## 2019-08-05 NOTE — Progress Notes (Signed)
Cardiology Office Note:    Date:  08/05/2019   ID:  ZAYAH CATOE, DOB [birth year], MRN [id number]  PCP:  Verdell Carmine., MD  Cardiologist:  Berniece Salines, DO  Electrophysiologist:  None   Referring MD: Verdell Carmine., MD   Chief Complaint  Patient presents with  . Follow-up  Follow-up for hypertension, palpitations and hyperlipidemia.  History of Present Illness:    Tonya Sanchez is a 69 y.o. female with a hx of hypertension, hyperlipidemia, diabetes, family history of premature coronary disease was last seen on cannula 06/12/2019 at that time he presented due to chest pain, shortness of breath and palpitations.    At the conclusion of the visit ZIO monitor was placed on the patient, echocardiogram was ordered as well as a CT coronary.  In the interim she was able to get her ZIO monitor completed.  However her echocardiogram and her CTA coronaries is still pending.  Today she tells me that she still is experiencing palpitations.  Of note since her last visit she has gone through orthopedic surgery due to her osteoarthritis.  Today she is in a sling of her right hand.  Past Medical History:  Diagnosis Date  . Achilles tendonitis, bilateral 02/04/2019  . Anxiety    occasional - no current med.  . Arthritis    "everywhere"  . Bladder tumor 05/03/2018  . Cervical spondylosis with radiculopathy 12/23/2017  . Cervical stenosis of spinal canal 12/23/2017   Added automatically from request for surgery [phone]  . Chronic pain syndrome 05/03/2018  . De Quervain's disease (tenosynovitis) 10/13/2017  . Diabetic peripheral neuropathy ([place]) 03/18/2019  . Essential hypertension 12/28/2017  . Fibromyalgia 05/03/2018  . Gastroesophageal reflux disease 05/06/2018  . Hand arthritis 01/08/2017  . History of DVT (deep vein thrombosis) 1960s   no problems since  . History of recurrent UTIs 05/03/2018  . 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 Hypertension    under control with meds., has been on med. x 3-4  yr.  . Leg swelling 02/04/2019  . Lumbar facet arthropathy 08/19/2017  . Lumbar radiculopathy 03/18/2019  . Mass of hand 10/2012   left  . Metatarsal stress fracture, left, initial encounter 11/01/2014  . Moderate episode of recurrent major depressive disorder ([place]) 05/03/2018  . Morbid obesity ([place]) 02/04/2019  . Other hyperlipidemia 05/03/2018  . Pain, joint, ankle and foot, unspecified laterality 11/01/2014  . Primary localized osteoarthrosis, ankle and foot 02/04/2019  . S/P cervical spinal fusion 04/02/2018  . Stenosing tenosynovitis of wrist 10/2012   left  . TIA (transient ischemic attack) 03/2010   no deficits  . Tremor, essential 10/21/2017  . Type 2 diabetes mellitus ([place]) 12/28/2017  . Wrist tendonitis 10/13/2017    Past Surgical History:  Procedure Laterality Date  . ABDOMINAL HYSTERECTOMY     partial  . ANTERIOR AND POSTERIOR VAGINAL REPAIR  04/07/2000   with vaginal vault suspension  . ANTERIOR CERVICAL DECOMP/DISCECTOMY FUSION  10/20/2000   C5-6  . CARPAL TUNNEL RELEASE Right 04/12/2003  . CARPAL TUNNEL RELEASE Left 05/25/2003  . CARPAL TUNNEL RELEASE Left 10/27/2012   Procedure: LEFT WRIST STENOSISNG TENOSYNOVITIS RELEASE;  Surgeon: Schuyler Amor, MD;  Location: [facility];  Service: Orthopedics;  Laterality: Left;  . CHOLECYSTECTOMY    . CLOSED MANIPULATION KNEE WITH STERIOD INJECTION Left 01/27/2000  . CYSTOSCOPY WITH BIOPSY  04/01/2011   bladder bx. x 4  . EXAMINATION UNDER ANESTHESIA Left 01/27/2000   ankle - with steroid injection  .  FOOT ARTHRODESIS, TRIPLE Bilateral   . FOOT GANGLION EXCISION Left 02/10/2002  . FOOT SURGERY Bilateral    spurs removed from both little toes  . KNEE ARTHROSCOPY Right   . PUBOVAGINAL SLING  04/07/2000  . TENDON RECONSTRUCTION Left    foot  . TOTAL KNEE ARTHROPLASTY Left 09/09/1999    Current Medications: Current Meds  Medication Sig  . amitriptyline (ELAVIL) 50 MG tablet TAKE 1 TABLET BY MOUTH EVERY DAY AT NIGHT  .  aspirin 325 MG tablet Take 325 mg by mouth daily.  Marland Kitchen atorvastatin (LIPITOR) 10 MG tablet Take 1 tablet by mouth daily.  . Cholecalciferol 125 MCG (5000 UT) TABS Take 5,000 Units by mouth daily.  . Dulaglutide (TRULICITY) 3 0000000 SOPN Inject 3 mg into the skin once a week.  . esomeprazole (NEXIUM) 40 MG capsule Take 40 mg by mouth daily before breakfast.  . furosemide (LASIX) 20 MG tablet Take 1 tablet (20 mg total) by mouth daily.  Marland Kitchen glipiZIDE (GLUCOTROL XL) 5 MG 24 hr tablet Take 1 tablet (5 mg total) by mouth daily.  Marland Kitchen HYDROcodone-acetaminophen (NORCO/VICODIN) 5-325 MG tablet Take by mouth.  . olmesartan (BENICAR) 20 MG tablet Take 1 tablet (20 mg total) by mouth every morning.  . potassium chloride SA (KLOR-CON M20) 20 MEQ tablet Take 1 tablet (20 mEq total) by mouth daily.  Marland Kitchen venlafaxine XR (EFFEXOR-XR) 150 MG 24 hr capsule TAKE 1 CAPSULE BY MOUTH EVERY DAY  . [DISCONTINUED] metoprolol tartrate (LOPRESSOR) 50 MG tablet Take 2 tabs(100 mg) 2 hours prior to CT if heart rate greater than 55     Allergies:   Keflex [cephalexin], Metformin hcl, and Percocet [oxycodone-acetaminophen]   Social History   Socioeconomic History  . Marital status: Married    Spouse name: Not on file  . Number of children: Not on file  . Years of education: Not on file  . Highest education level: Not on file  Occupational History  . Not on file  Tobacco Use  . Smoking status: Never Smoker  . Smokeless tobacco: Never Used  Substance and Sexual Activity  . Alcohol use: No  . Drug use: No  . Sexual activity: Not on file  Other Topics Concern  . Not on file  Social History Narrative  . Not on file   Social Determinants of Health   Financial Resource Strain:   . Difficulty of Paying Living Expenses: Not on file  Food Insecurity:   . Worried About Charity fundraiser in the Last Year: Not on file  . Ran Out of Food in the Last Year: Not on file  Transportation Needs:   . Lack of Transportation  (Medical): Not on file  . Lack of Transportation (Non-Medical): Not on file  Physical Activity:   . Days of Exercise per Week: Not on file  . Minutes of Exercise per Session: Not on file  Stress:   . Feeling of Stress : Not on file  Social Connections:   . Frequency of Communication with Friends and Family: Not on file  . Frequency of Social Gatherings with Friends and Family: Not on file  . Attends Religious Services: Not on file  . Active Member of Clubs or Organizations: Not on file  . Attends Archivist Meetings: Not on file  . Marital Status: Not on file     Family History: The patient's family history includes Breast cancer in her mother; COPD in her father; Diabetes in her mother; Heart disease in  her brother and father; Sjogren's syndrome in her sister; Stroke in her brother, father, and sister.  ROS:   Review of Systems  Constitution: Negative for decreased appetite, fever and weight gain.  HENT: Negative for congestion, ear discharge, hoarse voice and sore throat.   Eyes: Negative for discharge, redness, vision loss in right eye and visual halos.  Cardiovascular: Report palpitations.  Negative for chest pain, dyspnea on exertion, leg swelling, orthopnea.  Respiratory: Negative for cough, hemoptysis, shortness of breath and snoring.   Endocrine: Negative for heat intolerance and polyphagia.  Hematologic/Lymphatic: Negative for bleeding problem. Does not bruise/bleed easily.  Skin: Negative for flushing, nail changes, rash and suspicious lesions.  Musculoskeletal: Negative for arthritis, joint pain, muscle cramps, myalgias, neck pain and stiffness.  Gastrointestinal: Negative for abdominal pain, bowel incontinence, diarrhea and excessive appetite.  Genitourinary: Negative for decreased libido, genital sores and incomplete emptying.  Neurological: Negative for brief paralysis, focal weakness, headaches and loss of balance.  Psychiatric/Behavioral: Negative for  altered mental status, depression and suicidal ideas.  Allergic/Immunologic: Negative for HIV exposure and persistent infections.    EKGs/Labs/Other Studies Reviewed:    The following studies were reviewed today:   EKG: None today.  ZIO monitor. The patient wore the monitor for 7 days starting July 07, 2019. Indication: Palpitations The minimum heart rate was 66 bpm, maximum heart rate was 214  bpm, and average heart rate was 89 bpm. Predominant underlying rhythm was Sinus Rhythm. Idioventricular Rhythm was present.   2 Ventricular Tachycardia runs occurred, the run with the fastest interval lasting 4 beats with a maximum rate of 214 bpm, the longest lasting 4 beats with an average rate of 107 bpm.   4 Supraventricular Tachycardia runs occurred, the run with the fastest interval lasting 5 beats with a maximum rate of 174 bpm, the longest lasting 7 beats with an average rate of 108 bpm.  Premature atrial complexes were rare (<1.0%). Premature Ventricular complexes were rare (<1.0%).  No pauses, No AV block and no atrial fibrillation present. 1 patient triggered event and 4 diary events are associated with sinus rhythm.  Conclusion: This study is remarkable for the following:                      1.  2 runs of nonsustained ventricular tachycardia.                        2.  Paroxysmal supraventricular tachycardia, which is likely atrial tachycardia with variable block.    Recent Labs: 08/01/2019: BUN 14; Creatinine, Ser 0.87; Magnesium 1.9; Potassium 4.8; Sodium 143  Recent Lipid Panel    Component Value Date/Time   CHOL 109 08/01/2019 1531   TRIG 135 08/01/2019 1531   HDL 38 (L) 08/01/2019 1531   CHOLHDL 2.9 08/01/2019 1531   CHOLHDL 3.8 04/01/2010 0910   VLDL 30 04/01/2010 0910   LDLCALC 47 08/01/2019 1531    Physical Exam:    VS:  BP 140/90 (BP Location: Right Arm, Patient Position: Sitting, Cuff Size: Large)   Pulse 85   Ht 5\' 7"  (1.702 m)   Wt 269 lb (122  kg)   SpO2 99%   BMI 42.13 kg/m     Wt Readings from Last 3 Encounters:  08/05/19 269 lb (122 kg)  07/05/19 267 lb (121.1 kg)  10/27/12 251 lb (113.9 kg)     GEN: Well nourished, well developed in no acute distress HEENT: Normal NECK:  No JVD; No carotid bruits LYMPHATICS: No lymphadenopathy CARDIAC: S1S2 noted,RRR, no murmurs, rubs, gallops RESPIRATORY:  Clear to auscultation without rales, wheezing or rhonchi  ABDOMEN: Soft, non-tender, non-distended, +bowel sounds, no guarding. EXTREMITIES: No edema, No cyanosis, no clubbing MUSCULOSKELETAL:  No deformity  SKIN: Warm and dry NEUROLOGIC:  Alert and oriented x 3, non-focal PSYCHIATRIC:  Normal affect, good insight  ASSESSMENT:    1. PAT (paroxysmal atrial tachycardia) ([place])   2. NSVT (nonsustained ventricular tachycardia) ([place])   3. Essential hypertension   4. Obesity without serious comorbidity, unspecified classification, unspecified obesity type    PLAN:     I discussed with the patient about her ZIO monitor results which show evidence of paroxysmal atrial tachycardia as well as 2 runs of nonsustained ventricular tachycardia.  She still experiencing palpitations ,At this time we will going to try beta-blockers to help with symptomatic relief.   She is still pending a coronary CTA and her echocardiogram.  Once this test is done to review further recommendations will be made as appropriate.  Her blood pressure is slightly elevated at this time manually 140/90 mmHg, I am hoping that the addition of her Toprol-XL 12.5 mg daily will help with her blood pressure as well as symptomatic relief from the palpitations which is likely paroxysmal atrial tachycardia.  She is hoping to hopefully start a weight loss program which is going to be a gateway to bariatric surgery.  I encouraged the patient is a think she would benefit highly from this.  The patient is in agreement with the above plan. The patient left the office in stable  condition.  The patient will follow up in 3 months or sooner if needed.   Medication Adjustments/Labs and Tests Ordered: Current medicines are reviewed at length with the patient today.  Concerns regarding medicines are outlined above.  No orders of the defined types were placed in this encounter.  Meds ordered this encounter  Medications  . metoprolol succinate (TOPROL XL) 25 MG 24 hr tablet    Sig: Take 0.5 tablets (12.5 mg total) by mouth daily.    Dispense:  45 tablet    Refill:  1    Patient Instructions  Medication Instructions:  Your physician has recommended you make the following change in your medication:   START: Toprol XL 25 mg Take 1/2 tab (12.5 mg) daily  *If you need a refill on your cardiac medications before your next appointment, please call your pharmacy*  Lab Work: NOne If you have labs (blood work) drawn today and your tests are completely normal, you will receive your results only by: Marland Kitchen MyChart Message (if you have MyChart) OR . A paper copy in the mail If you have any lab test that is abnormal or we need to change your treatment, we will call you to review the results.  Testing/Procedures: None  Follow-Up: At [facility], you and your health needs are our priority.  As part of our continuing mission to provide you with exceptional heart care, we have created designated Provider Care Teams.  These Care Teams include your primary Cardiologist (physician) and Advanced Practice Providers (APPs -  Physician Assistants and Nurse Practitioners) who all work together to provide you with the care you need, when you need it.  Your next appointment:   3 month(s)  The format for your next appointment:   In Person  Provider:   Berniece Salines, DO  Other Instructions      Adopting a Healthy Lifestyle.  Know what a healthy weight is for you (roughly BMI <25) and aim to maintain this   Aim for 7+ servings of fruits and vegetables daily   65-80+ fluid  ounces of water or unsweet tea for healthy kidneys   Limit to max 1 drink of alcohol per day; avoid smoking/tobacco   Limit animal fats in diet for cholesterol and heart health - choose grass fed whenever available   Avoid highly processed foods, and foods high in saturated/trans fats   Aim for low stress - take time to unwind and care for your mental health   Aim for 150 min of moderate intensity exercise weekly for heart health, and weights twice weekly for bone health   Aim for 7-9 hours of sleep daily   When it comes to diets, agreement about the perfect plan isnt easy to find, even among the experts. Experts at the [employer] developed an idea known as the Healthy Eating Plate. Just imagine a plate divided into logical, healthy portions.   The emphasis is on diet quality:   Load up on vegetables and fruits - one-half of your plate: Aim for color and variety, and remember that potatoes dont count.   Go for whole grains - one-quarter of your plate: Whole wheat, barley, wheat berries, quinoa, oats, brown rice, and foods made with them. If you want pasta, go with whole wheat pasta.   Protein power - one-quarter of your plate: Fish, chicken, beans, and nuts are all healthy, versatile protein sources. Limit red meat.   The diet, however, does go beyond the plate, offering a few other suggestions.   Use healthy plant oils, such as olive, canola, soy, corn, sunflower and peanut. Check the labels, and avoid partially hydrogenated oil, which have unhealthy trans fats.   If youre thirsty, drink water. Coffee and tea are good in moderation, but skip sugary drinks and limit milk and dairy products to one or two daily servings.   The type of carbohydrate in the diet is more important than the amount. Some sources of carbohydrates, such as vegetables, fruits, whole grains, and beans-are healthier than others.   Finally, stay active  Signed, Berniece Salines, DO    08/05/2019 8:51 PM    [facility]

## 2019-08-09 ENCOUNTER — Telehealth (HOSPITAL_COMMUNITY): Payer: Self-pay | Admitting: Emergency Medicine

## 2019-08-09 NOTE — Telephone Encounter (Signed)
Left message on voicemail with name and callback number Avabella Wailes RN Navigator Cardiac Imaging [facility] [phone] Office [phone] Cell  

## 2019-08-10 ENCOUNTER — Ambulatory Visit (HOSPITAL_COMMUNITY)
Admission: RE | Admit: 2019-08-10 | Discharge: 2019-08-10 | Disposition: A | Discharge status: Home / Self Care | Payer: Medicare HMO | Source: Ambulatory Visit | Attending: Cardiology | Admitting: Cardiology

## 2019-08-10 ENCOUNTER — Other Ambulatory Visit: Payer: Self-pay

## 2019-08-10 DIAGNOSIS — R072 Precordial pain: Secondary | ICD-10-CM | POA: Diagnosis present

## 2019-08-10 MED ORDER — METOPROLOL TARTRATE 5 MG/5ML IV SOLN
5.0000 mg | INTRAVENOUS | Status: DC | PRN
  Administered 2019-08-10: 14:00:00 5 mg via INTRAVENOUS

## 2019-08-10 MED ORDER — IOHEXOL 350 MG/ML SOLN
100.0000 mL | Freq: Once | INTRAVENOUS | Status: AC | PRN
  Administered 2019-08-10: 100 mL via INTRAVENOUS

## 2019-08-10 MED ORDER — NITROGLYCERIN 0.4 MG SL SUBL
SUBLINGUAL_TABLET | SUBLINGUAL | Status: AC
  Filled 2019-08-10: qty 2

## 2019-08-10 MED ORDER — NITROGLYCERIN 0.4 MG SL SUBL
0.8000 mg | SUBLINGUAL_TABLET | Freq: Once | SUBLINGUAL | Status: AC
  Administered 2019-08-10: 14:00:00 0.8 mg via SUBLINGUAL

## 2019-08-10 MED ORDER — METOPROLOL TARTRATE 5 MG/5ML IV SOLN
INTRAVENOUS | Status: AC
  Filled 2019-08-10: qty 15

## 2019-08-25 ENCOUNTER — Ambulatory Visit (INDEPENDENT_AMBULATORY_CARE_PROVIDER_SITE_OTHER): Payer: Medicare HMO

## 2019-08-25 ENCOUNTER — Other Ambulatory Visit: Payer: Self-pay

## 2019-08-25 DIAGNOSIS — R002 Palpitations: Secondary | ICD-10-CM

## 2019-08-25 DIAGNOSIS — R072 Precordial pain: Secondary | ICD-10-CM

## 2019-08-25 DIAGNOSIS — I10 Essential (primary) hypertension: Secondary | ICD-10-CM

## 2019-08-25 NOTE — Progress Notes (Signed)
Complete echocardiogram has been performed.  Jimmy Odaliz Mcqueary RDCS, RVT 

## 2019-08-26 ENCOUNTER — Telehealth: Payer: Self-pay

## 2019-08-26 NOTE — Telephone Encounter (Signed)
LMTCB regarding echo results

## 2019-08-26 NOTE — Telephone Encounter (Signed)
-----   Message from Berniece Salines, DO sent at 08/26/2019  2:01 PM EST ----- The echo showed that the heart is not fully relaxing like it should ( diastolic dysfunction) ,but otherwise normal. I will discuss it at the next office visit.

## 2019-08-29 ENCOUNTER — Telehealth: Payer: Self-pay

## 2019-08-29 NOTE — Telephone Encounter (Signed)
The patient has been notified of the Echo result and verbalized understanding.  All questions (if any) were answered. Frederik Schmidt, RN 08/29/2019 10:31 AM

## 2019-08-29 NOTE — Telephone Encounter (Signed)
-----   Message from Berniece Salines, DO sent at 08/26/2019  2:01 PM EST ----- The echo showed that the heart is not fully relaxing like it should ( diastolic dysfunction) ,but otherwise normal. I will discuss it at the next office visit.

## 2019-09-23 DIAGNOSIS — S83241A Other tear of medial meniscus, current injury, right knee, initial encounter: Secondary | ICD-10-CM

## 2019-09-23 HISTORY — DX: Other tear of medial meniscus, current injury, right knee, initial encounter: S83.241A

## 2019-11-02 ENCOUNTER — Other Ambulatory Visit: Payer: Self-pay

## 2019-11-03 ENCOUNTER — Ambulatory Visit (INDEPENDENT_AMBULATORY_CARE_PROVIDER_SITE_OTHER): Payer: Medicare HMO | Admitting: Cardiology

## 2019-11-03 ENCOUNTER — Encounter: Payer: Self-pay | Admitting: Cardiology

## 2019-11-03 ENCOUNTER — Other Ambulatory Visit: Payer: Self-pay

## 2019-11-03 VITALS — BP 128/78 | HR 92 | Ht 67.0 in | Wt 254.0 lb

## 2019-11-03 DIAGNOSIS — I472 Ventricular tachycardia: Secondary | ICD-10-CM | POA: Diagnosis not present

## 2019-11-03 DIAGNOSIS — I471 Supraventricular tachycardia: Secondary | ICD-10-CM | POA: Diagnosis not present

## 2019-11-03 DIAGNOSIS — E7849 Other hyperlipidemia: Secondary | ICD-10-CM

## 2019-11-03 DIAGNOSIS — R0602 Shortness of breath: Secondary | ICD-10-CM | POA: Diagnosis not present

## 2019-11-03 DIAGNOSIS — E669 Obesity, unspecified: Secondary | ICD-10-CM | POA: Diagnosis not present

## 2019-11-03 DIAGNOSIS — I10 Essential (primary) hypertension: Secondary | ICD-10-CM

## 2019-11-03 DIAGNOSIS — I4729 Other ventricular tachycardia: Secondary | ICD-10-CM

## 2019-11-03 MED ORDER — FUROSEMIDE 40 MG PO TABS: 40.0000 mg | ORAL_TABLET | Freq: Every day | ORAL | 3 refills | Status: DC

## 2019-11-03 NOTE — Progress Notes (Signed)
Cardiology Office Note:    Date:  11/03/2019   ID:  Tonya Sanchez, DOB [birth year], MRN [id number]  PCP:  Verdell Carmine., MD  Cardiologist:  Berniece Salines, DO  Electrophysiologist:  None   Referring MD: Verdell Carmine., MD   Chief Complaint  Patient presents with  . Follow-up   " I am still short of breath"  History of Present Illness:    Tonya Sanchez is a 69 y.o. female with a hx of hypertension, hyperlipidemia, diabetes presents today for follow-up.  I did see the patient initially for shortness of breath chest pain and palpitations.  At that time we will get a ZIO monitor, echocardiogram as well as a coronary CTA.  The coronary CTA was normal, her echocardiogram did show evidence of diastolic dysfunction and the monitor showed NSVT and paroxysmal atrial tachycardia.  I did start the patient on beta-blocker for her NSVT and paroxysmal atrial tach.  Today she comes and tells me that she still is experiencing shortness of breath.  She also believes that she has been tired even more than she was in the past since being on the beta-blocker.  She denies chest pain.   Past Medical History:  Diagnosis Date  . Achilles tendonitis, bilateral 02/04/2019  . Acute medial meniscus tear of right knee 09/23/2019   Formatting of this note might be different from the original. Added automatically from request for surgery [phone]  . Anxiety    occasional - no current med.  . Arthritis    "everywhere"  . Bladder tumor 05/03/2018  . Cervical spondylosis 04/02/2018  . Cervical spondylosis with radiculopathy 12/23/2017  . Cervical stenosis of spinal canal 12/23/2017   Added automatically from request for surgery [phone]  . Chronic pain syndrome 05/03/2018  . De Quervain's disease (tenosynovitis) 10/13/2017  . Diabetic peripheral neuropathy ([place]) 03/18/2019  . Essential hypertension 12/28/2017  . Fibromyalgia 05/03/2018  . Gastroesophageal reflux disease 05/06/2018  . Hand arthritis 01/08/2017  .  History of DVT (deep vein thrombosis) 1960s   no problems since  . History of recurrent UTIs 05/03/2018  . 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 Hypertension    under control with meds., has been on med. x 3-4 yr.  . Leg swelling 02/04/2019  . Lumbar facet arthropathy 08/19/2017  . Lumbar radiculopathy 03/18/2019  . Mass of hand 10/2012   left  . Metatarsal stress fracture, left, initial encounter 11/01/2014  . Moderate episode of recurrent major depressive disorder ([place]) 05/03/2018  . Morbid obesity ([place]) 02/04/2019  . NSVT (nonsustained ventricular tachycardia) ([place]) 08/05/2019  . Obesity (BMI 30.0-34.9) 05/03/2018  . Other hyperlipidemia 05/03/2018  . Pain, joint, ankle and foot, unspecified laterality 11/01/2014  . PAT (paroxysmal atrial tachycardia) ([place]) 08/05/2019  . Primary localized osteoarthrosis, ankle and foot 02/04/2019  . Primary osteoarthritis of first carpometacarpal joint of left hand 07/08/2019   Formatting of this note might be different from the original. Added automatically from request for surgery [phone]  . S/P cervical spinal fusion 04/02/2018  . Stenosing tenosynovitis of wrist 10/2012   left  . TIA (transient ischemic attack) 03/2010   no deficits  . Tremor, essential 10/21/2017  . Type 2 diabetes mellitus ([place]) 12/28/2017  . Wrist tendonitis 10/13/2017    Past Surgical History:  Procedure Laterality Date  . ABDOMINAL HYSTERECTOMY     partial  . ANTERIOR AND POSTERIOR VAGINAL REPAIR  04/07/2000   with vaginal vault suspension  . ANTERIOR CERVICAL DECOMP/DISCECTOMY FUSION  10/20/2000  C5-6  . CARPAL TUNNEL RELEASE Right 04/12/2003  . CARPAL TUNNEL RELEASE Left 05/25/2003  . CARPAL TUNNEL RELEASE Left 10/27/2012   Procedure: LEFT WRIST STENOSISNG TENOSYNOVITIS RELEASE;  Surgeon: Schuyler Amor, MD;  Location: [facility];  Service: Orthopedics;  Laterality: Left;  . CHOLECYSTECTOMY    . CLOSED MANIPULATION KNEE WITH STERIOD INJECTION Left 01/27/2000  .  CYSTOSCOPY WITH BIOPSY  04/01/2011   bladder bx. x 4  . EXAMINATION UNDER ANESTHESIA Left 01/27/2000   ankle - with steroid injection  . FOOT ARTHRODESIS, TRIPLE Bilateral   . FOOT GANGLION EXCISION Left 02/10/2002  . FOOT SURGERY Bilateral    spurs removed from both little toes  . KNEE ARTHROSCOPY Right   . PUBOVAGINAL SLING  04/07/2000  . TENDON RECONSTRUCTION Left    foot  . TOTAL KNEE ARTHROPLASTY Left 09/09/1999    Current Medications: Current Meds  Medication Sig  . amitriptyline (ELAVIL) 50 MG tablet TAKE 1 TABLET BY MOUTH EVERY DAY AT NIGHT  . aspirin 325 MG tablet Take 325 mg by mouth daily.  Marland Kitchen atorvastatin (LIPITOR) 10 MG tablet Take 1 tablet by mouth daily.  . Cholecalciferol 125 MCG (5000 UT) TABS Take 5,000 Units by mouth daily.  . Dulaglutide (TRULICITY) 3 0000000 SOPN Inject 3 mg into the skin once a week.  . esomeprazole (NEXIUM) 40 MG capsule Take 40 mg by mouth daily before breakfast.  . glipiZIDE (GLUCOTROL XL) 5 MG 24 hr tablet Take 1 tablet (5 mg total) by mouth daily.  . metoprolol succinate (TOPROL XL) 25 MG 24 hr tablet Take 0.5 tablets (12.5 mg total) by mouth daily.  Marland Kitchen olmesartan (BENICAR) 20 MG tablet Take 1 tablet (20 mg total) by mouth every morning.  . Venlafaxine HCl 225 MG TB24 Take 225 mg by mouth daily.  . [DISCONTINUED] HYDROcodone-acetaminophen (NORCO/VICODIN) 5-325 MG tablet Take 1 tablet by mouth every 6 (six) hours as needed.     Allergies:   Keflex [cephalexin], Metformin hcl, and Percocet [oxycodone-acetaminophen]   Social History   Socioeconomic History  . Marital status: Married    Spouse name: Not on file  . Number of children: Not on file  . Years of education: Not on file  . Highest education level: Not on file  Occupational History  . Not on file  Tobacco Use  . Smoking status: Never Smoker  . Smokeless tobacco: Never Used  Substance and Sexual Activity  . Alcohol use: No  . Drug use: No  . Sexual activity: Not on file    Other Topics Concern  . Not on file  Social History Narrative  . Not on file   Social Determinants of Health   Financial Resource Strain:   . Difficulty of Paying Living Expenses:   Food Insecurity:   . Worried About Charity fundraiser in the Last Year:   . Arboriculturist in the Last Year:   Transportation Needs:   . Film/video editor (Medical):   Marland Kitchen Lack of Transportation (Non-Medical):   Physical Activity:   . Days of Exercise per Week:   . Minutes of Exercise per Session:   Stress:   . Feeling of Stress :   Social Connections:   . Frequency of Communication with Friends and Family:   . Frequency of Social Gatherings with Friends and Family:   . Attends Religious Services:   . Active Member of Clubs or Organizations:   . Attends Archivist Meetings:   .  Marital Status:      Family History: The patient's family history includes Breast cancer in her mother; COPD in her father; Diabetes in her mother; Heart disease in her brother and father; Sjogren's syndrome in her sister; Stroke in her brother, father, and sister.  ROS:   Review of Systems  Constitution: Negative for decreased appetite, fever and weight gain.  HENT: Negative for congestion, ear discharge, hoarse voice and sore throat.   Eyes: Negative for discharge, redness, vision loss in right eye and visual halos.  Cardiovascular: Negative for chest pain, dyspnea on exertion, leg swelling, orthopnea and palpitations.  Respiratory: Reports shortness of breath.  Negative for cough, hemoptysis, and snoring.   Endocrine: Negative for heat intolerance and polyphagia.  Hematologic/Lymphatic: Negative for bleeding problem. Does not bruise/bleed easily.  Skin: Negative for flushing, nail changes, rash and suspicious lesions.  Musculoskeletal: Negative for arthritis, joint pain, muscle cramps, myalgias, neck pain and stiffness.  Gastrointestinal: Negative for abdominal pain, bowel incontinence, diarrhea and  excessive appetite.  Genitourinary: Negative for decreased libido, genital sores and incomplete emptying.  Neurological: Negative for brief paralysis, focal weakness, headaches and loss of balance.  Psychiatric/Behavioral: Negative for altered mental status, depression and suicidal ideas.  Allergic/Immunologic: Negative for HIV exposure and persistent infections.   Zio Monitor: The patient wore the monitor for 7 days starting July 07, 2019. Indication: Palpitations The minimum heart rate was 66 bpm, maximum heart rate was 214  bpm, and average heart rate was 89 bpm. Predominant underlying rhythm was Sinus Rhythm. Idioventricular Rhythm was present.   2 Ventricular Tachycardia runs occurred, the run with the fastest interval lasting 4 beats with a maximum rate of 214 bpm, the longest lasting 4 beats with an average rate of 107 bpm.   4 Supraventricular Tachycardia runs occurred, the run with the fastest interval lasting 5 beats with a maximum rate of 174 bpm, the longest lasting 7 beats with an average rate of 108 bpm.  Premature atrial complexes were rare (<1.0%). Premature Ventricular complexes were rare (<1.0%).  No pauses, No AV block and no atrial fibrillation present. 1 patient triggered event and 4 diary events are associated with sinus rhythm.  Conclusion: This study is remarkable for the following:                      1.  2 runs of nonsustained ventricular tachycardia.                        2.  Paroxysmal supraventricular tachycardia, which is likely atrial tachycardia with variable block.      EKGs/Labs/Other Studies Reviewed:    The following studies were reviewed today:   EKG: None today  TTE IMPRESSIONS 08/25/2019 1. Left ventricular ejection fraction, by estimation, is 60 to 65%. The  left ventricle has normal function. The left ventricle has no regional  wall motion abnormalities. Left ventricular diastolic parameters are  consistent with Grade I diastolic   dysfunction (impaired relaxation).   FINDINGS  Left Ventricle: Left ventricular ejection fraction, by estimation, is 60  to 65%. The left ventricle has normal function. The left ventricle has no  regional wall motion abnormalities. The left ventricular internal cavity  size was normal in size. There is  no left ventricular hypertrophy. Left ventricular diastolic parameters  are consistent with Grade I diastolic dysfunction (impaired relaxation).   Right Ventricle: The right ventricular size is normal. No increase in  right ventricular wall  thickness. Right ventricular systolic function is  normal. There is normal pulmonary artery systolic pressure. The tricuspid  regurgitant velocity is 2.13 m/s, and  with an assumed right atrial pressure of 10 mmHg, the estimated right  ventricular systolic pressure is [id number] mmHg.   Left Atrium: Left atrial size was normal in size.   Right Atrium: Right atrial size was normal in size.   Pericardium: There is no evidence of pericardial effusion.   Mitral Valve: The mitral valve is normal in structure and function. Normal  mobility of the mitral valve leaflets. No evidence of mitral valve  regurgitation. No evidence of mitral valve stenosis.   Tricuspid Valve: The tricuspid valve is normal in structure. Tricuspid  valve regurgitation is not demonstrated. No evidence of tricuspid  stenosis.   Aortic Valve: The aortic valve is normal in structure and function. Aortic  valve regurgitation is not visualized. No aortic stenosis is present.   Pulmonic Valve: The pulmonic valve was normal in structure. Pulmonic valve  regurgitation is not visualized. No evidence of pulmonic stenosis.   Aorta: The aortic root is normal in size and structure.   Venous: The inferior vena cava is normal in size with greater than 50%  respiratory variability, suggesting right atrial pressure of 3 mmHg.   IAS/Shunts: No atrial level shunt detected by color flow  Doppler.   Recent Labs: 08/01/2019: BUN 14; Creatinine, Ser 0.87; Magnesium 1.9; Potassium 4.8; Sodium 143  Recent Lipid Panel    Component Value Date/Time   CHOL 109 08/01/2019 1531   TRIG 135 08/01/2019 1531   HDL 38 (L) 08/01/2019 1531   CHOLHDL 2.9 08/01/2019 1531   CHOLHDL 3.8 04/01/2010 0910   VLDL 30 04/01/2010 0910   LDLCALC 47 08/01/2019 1531    Physical Exam:    VS:  BP 128/78 (BP Location: Right Arm, Patient Position: Sitting, Cuff Size: Large)   Pulse 92   Ht 5\' 7"  (1.702 m)   Wt 254 lb (115.2 kg)   SpO2 98%   BMI 39.78 kg/m     Wt Readings from Last 3 Encounters:  11/03/19 254 lb (115.2 kg)  08/05/19 269 lb (122 kg)  07/05/19 267 lb (121.1 kg)     GEN: Well nourished, well developed in no acute distress HEENT: Normal NECK: No JVD; No carotid bruits LYMPHATICS: No lymphadenopathy CARDIAC: S1S2 noted,RRR, no murmurs, rubs, gallops RESPIRATORY:  Clear to auscultation without rales, wheezing or rhonchi  ABDOMEN: Soft, non-tender, non-distended, +bowel sounds, no guarding. EXTREMITIES: No edema, No cyanosis, no clubbing MUSCULOSKELETAL:  No deformity  SKIN: Warm and dry NEUROLOGIC:  Alert and oriented x 3, non-focal PSYCHIATRIC:  Normal affect, good insight  ASSESSMENT:    1. Shortness of breath   2. NSVT (nonsustained ventricular tachycardia) ([place])   3. PAT (paroxysmal atrial tachycardia) ([facility])   4. Obesity (BMI 30-39.9)   5. Essential hypertension   6. Other hyperlipidemia    PLAN:     Her shortness of breath is still concerning, this could be multifactorial.  Due to her diastolic dysfunction I will Increase her Lasix to hopefully see if this will help.  In addition I am going to refer the patient to pulmonary for consideration for pulmonary function test.  We also discussed possibility of weight loss which could help as well.  She really would like to get off the beta-blocker.  We will get her off the beta-blocker for now as a trial.  Shared  decision if the patient starts to experience  more palpitations we discussed starting the patient on another beta-blocker or a trial of calcium channel blocker.  Obesity-the patient understands the need to lose weight with diet and exercise. We have discussed specific strategies for this.  Hypertension-blood pressures acceptable at this time  Hyperlipidemia-continue patient on her Lipitor.    The patient is in agreement with the above plan. The patient left the office in stable condition.  The patient will follow up in 6 months or sooner if needed.   Medication Adjustments/Labs and Tests Ordered: Current medicines are reviewed at length with the patient today.  Concerns regarding medicines are outlined above.  Orders Placed This Encounter  Procedures  . Basic Metabolic Panel (BMET)  . Magnesium  . Ambulatory referral to Pulmonology   Meds ordered this encounter  Medications  . furosemide (LASIX) 40 MG tablet    Sig: Take 1 tablet (40 mg total) by mouth daily.    Dispense:  90 tablet    Refill:  3    Patient Instructions  Medication Instructions:  Your physician has recommended you make the following change in your medication:  INCREASE: Lasix 40 mg take one tablet by mouth daily.  *If you need a refill on your cardiac medications before your next appointment, please call your pharmacy*   Lab Work: Your physician recommends that you return for lab work in: [place] If you have labs (blood work) drawn today and your tests are completely normal, you will receive your results only by: Marland Kitchen MyChart Message (if you have MyChart) OR . A paper copy in the mail If you have any lab test that is abnormal or we need to change your treatment, we will call you to review the results.   Testing/Procedures: None   Follow-Up: At [facility], you and your health needs are our priority.  As part of our continuing mission to provide you with exceptional heart care, we have created  designated Provider Care Teams.  These Care Teams include your primary Cardiologist (physician) and Advanced Practice Providers (APPs -  Physician Assistants and Nurse Practitioners) who all work together to provide you with the care you need, when you need it.  We recommend signing up for the patient portal called "MyChart".  Sign up information is provided on this After Visit Summary.  MyChart is used to connect with patients for Virtual Visits (Telemedicine).  Patients are able to view lab/test results, encounter notes, upcoming appointments, etc.  Non-urgent messages can be sent to your provider as well.   To learn more about what you can do with MyChart, go to [email or web address].    Your next appointment:   6 month(s)  The format for your next appointment:   In Person  Provider:   Berniece Salines, DO   Other Instructions      Adopting a Healthy Lifestyle.  Know what a healthy weight is for you (roughly BMI <25) and aim to maintain this   Aim for 7+ servings of fruits and vegetables daily   65-80+ fluid ounces of water or unsweet tea for healthy kidneys   Limit to max 1 drink of alcohol per day; avoid smoking/tobacco   Limit animal fats in diet for cholesterol and heart health - choose grass fed whenever available   Avoid highly processed foods, and foods high in saturated/trans fats   Aim for low stress - take time to unwind and care for your mental health   Aim for 150 min of moderate intensity exercise weekly  for heart health, and weights twice weekly for bone health   Aim for 7-9 hours of sleep daily   When it comes to diets, agreement about the perfect plan isnt easy to find, even among the experts. Experts at the [employer] developed an idea known as the Healthy Eating Plate. Just imagine a plate divided into logical, healthy portions.   The emphasis is on diet quality:   Load up on vegetables and fruits - one-half of your plate: Aim for  color and variety, and remember that potatoes dont count.   Go for whole grains - one-quarter of your plate: Whole wheat, barley, wheat berries, quinoa, oats, brown rice, and foods made with them. If you want pasta, go with whole wheat pasta.   Protein power - one-quarter of your plate: Fish, chicken, beans, and nuts are all healthy, versatile protein sources. Limit red meat.   The diet, however, does go beyond the plate, offering a few other suggestions.   Use healthy plant oils, such as olive, canola, soy, corn, sunflower and peanut. Check the labels, and avoid partially hydrogenated oil, which have unhealthy trans fats.   If youre thirsty, drink water. Coffee and tea are good in moderation, but skip sugary drinks and limit milk and dairy products to one or two daily servings.   The type of carbohydrate in the diet is more important than the amount. Some sources of carbohydrates, such as vegetables, fruits, whole grains, and beans-are healthier than others.   Finally, stay active  Signed, Berniece Salines, DO  11/03/2019 7:30 PM    [place]

## 2019-11-03 NOTE — Patient Instructions (Signed)
Medication Instructions:  Your physician has recommended you make the following change in your medication:  INCREASE: Lasix 40 mg take one tablet by mouth daily.  *If you need a refill on your cardiac medications before your next appointment, please call your pharmacy*   Lab Work: Your physician recommends that you return for lab work in: [place] If you have labs (blood work) drawn today and your tests are completely normal, you will receive your results only by: Marland Kitchen MyChart Message (if you have MyChart) OR . A paper copy in the mail If you have any lab test that is abnormal or we need to change your treatment, we will call you to review the results.   Testing/Procedures: None   Follow-Up: At [facility], you and your health needs are our priority.  As part of our continuing mission to provide you with exceptional heart care, we have created designated Provider Care Teams.  These Care Teams include your primary Cardiologist (physician) and Advanced Practice Providers (APPs -  Physician Assistants and Nurse Practitioners) who all work together to provide you with the care you need, when you need it.  We recommend signing up for the patient portal called "MyChart".  Sign up information is provided on this After Visit Summary.  MyChart is used to connect with patients for Virtual Visits (Telemedicine).  Patients are able to view lab/test results, encounter notes, upcoming appointments, etc.  Non-urgent messages can be sent to your provider as well.   To learn more about what you can do with MyChart, go to [email or web address].    Your next appointment:   6 month(s)  The format for your next appointment:   In Person  Provider:   Berniece Salines, DO   Other Instructions

## 2019-11-04 ENCOUNTER — Telehealth: Payer: Self-pay

## 2019-11-04 LAB — MAGNESIUM: Magnesium: 2.2 mg/dL (ref 1.6–2.3)

## 2019-11-04 LAB — BASIC METABOLIC PANEL
BUN/Creatinine Ratio: 14 (ref 12–28)
BUN: 15 mg/dL (ref 8–27)
CO2: 23 mmol/L (ref 20–29)
Calcium: 9.9 mg/dL (ref 8.7–10.3)
Chloride: 99 mmol/L (ref 96–106)
Creatinine, Ser: 1.05 mg/dL — ABNORMAL HIGH (ref 0.57–1.00)
GFR calc Af Amer: 63 mL/min/{1.73_m2} (ref >59)
GFR calc non Af Amer: 54 mL/min/{1.73_m2} — ABNORMAL LOW (ref >59)
Glucose: 73 mg/dL (ref 65–99)
Potassium: 4.7 mmol/L (ref 3.5–5.2)
Sodium: 139 mmol/L (ref 134–144)

## 2019-11-04 NOTE — Telephone Encounter (Signed)
-----   Message from Berniece Salines, DO sent at 11/04/2019  8:34 AM EDT ----- Creatinine slightly elevated.  We will continue to monitor.  Continue all medications as prescribed

## 2019-11-04 NOTE — Telephone Encounter (Signed)
Spoke with patient regarding results and recommendation.  Patient verbalizes understanding and is agreeable to plan of care. Advised patient to call back with any issues or concerns.  

## 2019-12-25 ENCOUNTER — Other Ambulatory Visit: Payer: Self-pay | Admitting: Cardiology

## 2020-02-14 ENCOUNTER — Institutional Professional Consult (permissible substitution): Payer: Medicare HMO | Admitting: Pulmonary Disease

## 2020-03-29 ENCOUNTER — Encounter: Payer: Self-pay | Admitting: Internal Medicine

## 2020-03-29 ENCOUNTER — Other Ambulatory Visit: Payer: Self-pay

## 2020-03-29 ENCOUNTER — Ambulatory Visit: Payer: Medicare HMO | Admitting: Internal Medicine

## 2020-03-29 VITALS — BP 140/80 | HR 95 | Temp 97.4°F | Ht 67.0 in | Wt 257.5 lb

## 2020-03-29 DIAGNOSIS — K219 Gastro-esophageal reflux disease without esophagitis: Secondary | ICD-10-CM

## 2020-03-29 DIAGNOSIS — R0602 Shortness of breath: Secondary | ICD-10-CM | POA: Diagnosis not present

## 2020-03-29 DIAGNOSIS — Z01811 Encounter for preprocedural respiratory examination: Secondary | ICD-10-CM | POA: Diagnosis not present

## 2020-03-29 NOTE — Progress Notes (Addendum)
Tonya Sanchez    [id number]    Jul 10, 1950  Primary Care Physician:Spry, Marsh Dolly., MD  Referring Physician: Berniece Salines, DO [address] [place],  [place] [address] Reason for Consultation: shortness of breath, surgical clearance Date of Consultation: 03/29/2020  Chief complaint:   Chief Complaint  Patient presents with  . Consult    SHOB with activity x 1 year.  also is scheduled to have surgery on the 21st and will need surgical clearance.  see form     HPI: Tonya Sanchez is a 69 y.o. who gets sob for the past year.  Minimal exertion including ADLs. She can no longer work in her garden or yard outside due to dyspnea. She denies chest tightness but does have pressure. She does have a cough which is dry and usually at night which wakes her up from sleep.  She does have reflux and takes nexium.  No wheezing.   No prior pneumonia or bronchitis. No childhood respiratory disease.   Denies any trouble with her sleep, husband denies snoring or witnessed apnea.  Has never fallen asleep without meaning to   She feels dyspnea is related to weight. She has gained 60 lbs in the last 3 years without intention. She is less active than she used to be.   She is also having early satiety and abdominal  Pain from her hernia and is having umbilica hernia repair later this month.No prior complications from general anesthesia. Has had multiple surgeries without complications.    Social history:  Occupation: Worked as a Chartered loss adjuster.  Exposures: lives at home with husband, no pets.  Smoking history: never smoker, passive smoke exposure in childhood, none as an adult.  Social History   Occupational History  . Not on file  Tobacco Use  . Smoking status: Never Smoker  . Smokeless tobacco: Never Used  Substance and Sexual Activity  . Alcohol use: No  . Drug use: No  . Sexual activity: Not on file    Relevant family history:  Family History  Problem Relation Age of  Onset  . Breast cancer Mother   . Diabetes Mother   . COPD Father   . Heart disease Father   . Stroke Father   . Stroke Sister   . Sjogren's syndrome Sister   . Heart disease Brother   . Stroke Brother   . Emphysema Brother     Past Medical History:  Diagnosis Date  . Achilles tendonitis, bilateral 02/04/2019  . Acute medial meniscus tear of right knee 09/23/2019   Formatting of this note might be different from the original. Added automatically from request for surgery [phone]  . Anxiety    occasional - no current med.  . Arthritis    "everywhere"  . Bladder tumor 05/03/2018  . Cervical spondylosis 04/02/2018  . Cervical spondylosis with radiculopathy 12/23/2017  . Cervical stenosis of spinal canal 12/23/2017   Added automatically from request for surgery [phone]  . Chronic pain syndrome 05/03/2018  . De Quervain's disease (tenosynovitis) 10/13/2017  . Diabetic peripheral neuropathy ([place]) 03/18/2019  . Essential hypertension 12/28/2017  . Fibromyalgia 05/03/2018  . Gastroesophageal reflux disease 05/06/2018  . Hand arthritis 01/08/2017  . History of DVT (deep vein thrombosis) 1960s   no problems since  . History of recurrent UTIs 05/03/2018  . 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 Hypertension    under control with meds., has been on med. x 3-4 yr.  Marland Kitchen  Leg swelling 02/04/2019  . Lumbar facet arthropathy 08/19/2017  . Lumbar radiculopathy 03/18/2019  . Mass of hand 10/2012   left  . Metatarsal stress fracture, left, initial encounter 11/01/2014  . Moderate episode of recurrent major depressive disorder ([place]) 05/03/2018  . Morbid obesity ([place]) 02/04/2019  . NSVT (nonsustained ventricular tachycardia) ([place]) 08/05/2019  . Obesity (BMI 30.0-34.9) 05/03/2018  . Other hyperlipidemia 05/03/2018  . Pain, joint, ankle and foot, unspecified laterality 11/01/2014  . PAT (paroxysmal atrial tachycardia) ([place]) 08/05/2019  . Primary localized osteoarthrosis, ankle and foot 02/04/2019  . Primary osteoarthritis  of first carpometacarpal joint of left hand 07/08/2019   Formatting of this note might be different from the original. Added automatically from request for surgery [phone]  . S/P cervical spinal fusion 04/02/2018  . Stenosing tenosynovitis of wrist 10/2012   left  . TIA (transient ischemic attack) 03/2010   no deficits  . Tremor, essential 10/21/2017  . Type 2 diabetes mellitus ([place]) 12/28/2017  . Wrist tendonitis 10/13/2017    Past Surgical History:  Procedure Laterality Date  . ABDOMINAL HYSTERECTOMY     partial  . ANTERIOR AND POSTERIOR VAGINAL REPAIR  04/07/2000   with vaginal vault suspension  . ANTERIOR CERVICAL DECOMP/DISCECTOMY FUSION  10/20/2000   C5-6  . CARPAL TUNNEL RELEASE Right 04/12/2003  . CARPAL TUNNEL RELEASE Left 05/25/2003  . CARPAL TUNNEL RELEASE Left 10/27/2012   Procedure: LEFT WRIST STENOSISNG TENOSYNOVITIS RELEASE;  Surgeon: Schuyler Amor, MD;  Location: [facility];  Service: Orthopedics;  Laterality: Left;  . CHOLECYSTECTOMY    . CLOSED MANIPULATION KNEE WITH STERIOD INJECTION Left 01/27/2000  . CYSTOSCOPY WITH BIOPSY  04/01/2011   bladder bx. x 4  . EXAMINATION UNDER ANESTHESIA Left 01/27/2000   ankle - with steroid injection  . FOOT ARTHRODESIS, TRIPLE Bilateral   . FOOT GANGLION EXCISION Left 02/10/2002  . FOOT SURGERY Bilateral    spurs removed from both little toes  . KNEE ARTHROSCOPY Right   . PUBOVAGINAL SLING  04/07/2000  . TENDON RECONSTRUCTION Left    foot  . TOTAL KNEE ARTHROPLASTY Left 09/09/1999     Physical Exam: Blood pressure 140/80, pulse 95, temperature (!) 97.4 F (36.3 C), temperature source Oral, height 5\' 7"  (1.702 m), weight 257 lb 8 oz (116.8 kg), SpO2 98 %. Gen:      No acute distress Lungs:    No increased respiratory effort, symmetric chest wall excursion, clear to auscultation bilaterally, no wheezes or crackles CV:         Regular rate and rhythm; no murmurs, rubs, or gallops.  No pedal edema Abd:     Obese, +  bowel sounds; soft, non-tender; no distension MSK: no acute synovitis of DIP or PIP joints, no mechanics hands.  Skin:      Warm and dry; no rashes Neuro: normal speech, no focal facial asymmetry Psych: alert and oriented x3, normal mood and affect   Data Reviewed/Medical Decision Making:  Independent interpretation of tests: Imaging: . Review of patient's CT coronary images revealed no acute pulmonary process in limited lung windows. The patient's images have been independently reviewed by me.    PFTs: None on file Labs:  Lab Results  Component Value Date   WBC 7.3 04/01/2010   HGB 13.6 04/01/2011   HCT 40.0 04/01/2011   MCV 83.4 04/01/2010   PLT 198 04/01/2010   Lab Results  Component Value Date   NA 139 11/03/2019   K 4.7 11/03/2019  CL 99 11/03/2019   CO2 23 11/03/2019     Echocardiogram March 2021 Stress test normal per patient earlier this year. Was pharmacologic.   Immunization status:  Immunization History  Administered Date(s) Administered  . Influenza-Unspecified 03/23/2018    . I reviewed prior external note(s) from cardiology,  . I reviewed the result(s) of the labs and imaging as noted above.  . I have ordered PFT   Assessment:  Shortness of breath Preoperative pulmonary evaluation GERD  Plan/Recommendations: Patient is low risk as detailed below for postoperative pulmonary complications. Will get a full set of PFTs for evaluating dyspnea. This does not have to be done before her surgery.  Continue PPI for reflux. Discussed lifestyle modifications. I will send a copy of these findings to Dr. Raul Del at [facility] in [place].  ASSESSMENT AND RECOMMENDATIONS:  Preoperative Risk Calculation: The features of this patient's history that contribute to the pulmonary risk assessment include: Age,  General anesthesia  This patient has a low risk of post-operative pulmonary complications by ARISCAT Index.  The absolute assessment of  risk/benefit of the procedure is deferred to the primary team's evaluation.  - Patient's Estimated risk of postoperative respiratory failure is 18 points, 1.6% based on the ARISCAT Index.   0 to 25 points: Low risk: 1.2% pulmonary complication rate  26 to 44 points: Intermediate risk: 81.1% pulmonary complication rate  45 to 123 points: High risk: 88.6% pulmonary complication rate  Postoperative respiratory failure (PRF) is considered as failure to wean from mechanical ventilation within 48 hours of surgery or unplanned intubation/reintubation postoperatively. The validated risk calculator provides a risk estimate of PRF and is anticipated to aid in surgical decision-making and informed patient consent.  However risk can be accepted given the potential benefit of this intervention and it is not prohibitive.  RECOMMENDATIONS:  In order to minimize the risk of complications and optimize pulmonary status, we recommend the following: - PFT, Lung volumes and DLCO - Encourage aggressive incentive spirometry hourly both peri-operatively and post-operatively as tolerated  - Early ambulation and physical therapy as tolerated post-operatively - Adequate pain control especially in the setting of abdominal and thoracic surgery - Bronchodilators as needed for wheezing or shortness of breath - Intraoperatively keep OR time to the shortest as possible   ARISCAT: Mazo et al. Anesthesiology [phone]   Return to Care: Return in about 3 months (around 06/29/2020).  Lenice Llamas, MD Pulmonary and [place]  CC: Berniece Salines, [place]

## 2020-03-29 NOTE — Patient Instructions (Signed)
The patient should have follow up scheduled with myself in 3 months.   Prior to next visit patient should have:  Full set of PFTs   What is GERD? Gastroesophageal reflux disease (GERD) is gastroesophageal reflux diseasewhich occurs when the lower esophageal sphincter (LES) opens spontaneously, for varying periods of time, or does not close properly and stomach contents rise up into the esophagus. GER is also called acid reflux or acid regurgitation, because digestive juices--called acids--rise up with the food. The esophagus is the tube that carries food from the mouth to the stomach. The LES is a ring of muscle at the bottom of the esophagus that acts like a valve between the esophagus and stomach.  When acid reflux occurs, food or fluid can be tasted in the back of the mouth. When refluxed stomach acid touches the lining of the esophagus it may cause a burning sensation in the chest or throat called heartburn or acid indigestion. Occasional reflux is common. Persistent reflux that occurs more than twice a week is considered GERD, and it can eventually lead to more serious health problems. People of all ages can have GERD. Studies have shown that GERD may worsen or contribute to asthma, chronic cough, and pulmonary fibrosis.   What are the symptoms of GERD? The main symptom of GERD in adults is frequent heartburn, also called acid indigestion--burning-type pain in the lower part of the mid-chest, behind the breast bone, and in the mid-abdomen.  Not all reflux is acidic in nature, and many patients don't have heart burn at all. Sometimes it feels like a cough (either dry or with mucus), choking sensation, asthma, shortness of breath, waking up at night, frequent throat clearing, or trouble swallowing.    What causes GERD? The reason some people develop GERD is still unclear. However, research shows that in people with GERD, the LES relaxes while the rest of the esophagus is working. Anatomical  abnormalities such as a hiatal hernia may also contribute to GERD. A hiatal hernia occurs when the upper part of the stomach and the LES move above the diaphragm, the muscle wall that separates the stomach from the chest. Normally, the diaphragm helps the LES keep acid from rising up into the esophagus. When a hiatal hernia is present, acid reflux can occur more easily. A hiatal hernia can occur in people of any age and is most often a normal finding in otherwise healthy people over age 48. Most of the time, a hiatal hernia produces no symptoms.   Other factors that may contribute to GERD include - Obesity or recent weight gain - Pregnancy  - Smoking  - Diet - Certain medications  Common foods that can worsen reflux symptoms include: - carbonated beverages - artificial sweeteners - citrus fruits  - chocolate  - drinks with caffeine or alcohol  - fatty and fried foods  - garlic and onions  - mint flavorings  - spicy foods  - tomato-based foods, like spaghetti sauce, salsa, chili, and pizza   Lifestyle Changes If you smoke, stop.  Avoid foods and beverages that worsen symptoms (see above.) Lose weight if needed.  Eat small, frequent meals.  Wear loose-fitting clothes.  Avoid lying down for 3 hours after a meal.  Raise the head of your bed 6 to 8 inches by securing wood blocks under the bedposts. Just using extra pillows will not help, but using a wedge-shaped pillow may be helpful.  Medications  H2 blockers, such as cimetidine (Tagamet HB), famotidine (Pepcid  AC), nizatidine (Axid AR), and ranitidine (Zantac 75), decrease acid production. They are available in prescription strength and over-the-counter strength. These drugs provide short-term relief and are effective for about half of those who have GERD symptoms.  Proton pump inhibitors include omeprazole (Prilosec, Zegerid), lansoprazole (Prevacid), pantoprazole (Protonix), rabeprazole (Aciphex), and esomeprazole (Nexium), which are  available by prescription. Prilosec is also available in over-the-counter strength. Proton pump inhibitors are more effective than H2 blockers and can relieve symptoms and heal the esophageal lining in almost everyone who has GERD.  Because drugs work in different ways, combinations of medications may help control symptoms. People who get heartburn after eating may take both antacids and H2 blockers. The antacids work first to neutralize the acid in the stomach, and then the H2 blockers act on acid production. By the time the antacid stops working, the H2 blocker will have stopped acid production. Your health care provider is the best source of information about how to use medications for GERD.   Points to Remember 1. You can have GERD without having heartburn. Your symptoms could include a dry cough, asthma symptoms, or trouble swallowing.  2. Taking medications daily as prescribed is important in controlling you symptoms.  Sometimes it can take up to 8 weeks to fully achieve the effects of the medications prescribed.  3. Coughing related to GERD can be difficult to treat and is very frustrating!  However, it is important to stick with these medications and lifestyle modifications before pursuing more aggressive or invasive test and treatments.

## 2020-04-25 ENCOUNTER — Other Ambulatory Visit: Payer: Self-pay | Admitting: Internal Medicine

## 2020-04-25 ENCOUNTER — Other Ambulatory Visit: Payer: Self-pay

## 2020-04-25 ENCOUNTER — Ambulatory Visit (INDEPENDENT_AMBULATORY_CARE_PROVIDER_SITE_OTHER): Payer: Medicare HMO | Admitting: Internal Medicine

## 2020-04-25 DIAGNOSIS — R0602 Shortness of breath: Secondary | ICD-10-CM | POA: Diagnosis not present

## 2020-04-25 LAB — PULMONARY FUNCTION TEST
DL/VA % pred: 144 %
DL/VA: 5.91 ml/min/mmHg/L
DLCO cor % pred: 115 %
DLCO cor: 23.5 ml/min/mmHg
DLCO unc % pred: 115 %
DLCO unc: 23.5 ml/min/mmHg
FEF 25-75 Post: 2.24 L/sec
FEF 25-75 Pre: 1.93 L/sec
FEF2575-%Change-Post: 15 %
FEF2575-%Pred-Post: 113 %
FEF2575-%Pred-Pre: 97 %
FEV1-%Change-Post: 2 %
FEV1-%Pred-Post: 80 %
FEV1-%Pred-Pre: 78 %
FEV1-Post: 1.91 L
FEV1-Pre: 1.86 L
FEV1FVC-%Change-Post: 1 %
FEV1FVC-%Pred-Pre: 107 %
FEV6-%Change-Post: 1 %
FEV6-%Pred-Post: 75 %
FEV6-%Pred-Pre: 75 %
FEV6-Post: 2.29 L
FEV6-Pre: 2.26 L
FEV6FVC-%Change-Post: 0 %
FEV6FVC-%Pred-Post: 104 %
FEV6FVC-%Pred-Pre: 103 %
FVC-%Change-Post: 0 %
FVC-%Pred-Post: 72 %
FVC-%Pred-Pre: 72 %
FVC-Post: 2.29 L
FVC-Pre: 2.27 L
Post FEV1/FVC ratio: 83 %
Post FEV6/FVC ratio: 100 %
Pre FEV1/FVC ratio: 82 %
Pre FEV6/FVC Ratio: 99 %
RV % pred: 104 %
RV: 2.35 L
TLC % pred: 90 %
TLC: 4.76 L

## 2020-04-25 NOTE — Progress Notes (Signed)
PFT done today. 

## 2020-05-14 ENCOUNTER — Telehealth: Payer: Self-pay | Admitting: Internal Medicine

## 2020-05-14 NOTE — Telephone Encounter (Signed)
Spoke with the pt  She is asking for the results of her PFT done 04/25/20  She has appt with Dr Shearon Stalls on 07/11/2020, but does not want to wait until then to hear about her results, thanks!

## 2020-05-15 NOTE — Telephone Encounter (Signed)
Spoke with pt. She is aware of results. Nothing further was needed.  

## 2020-05-15 NOTE — Telephone Encounter (Signed)
PFTs were normal.

## 2020-07-03 ENCOUNTER — Other Ambulatory Visit: Payer: Self-pay

## 2020-07-03 DIAGNOSIS — F419 Anxiety disorder, unspecified: Secondary | ICD-10-CM | POA: Insufficient documentation

## 2020-07-03 DIAGNOSIS — M1990 Unspecified osteoarthritis, unspecified site: Secondary | ICD-10-CM | POA: Insufficient documentation

## 2020-07-03 DIAGNOSIS — I10 Essential (primary) hypertension: Secondary | ICD-10-CM | POA: Insufficient documentation

## 2020-07-05 ENCOUNTER — Ambulatory Visit (INDEPENDENT_AMBULATORY_CARE_PROVIDER_SITE_OTHER): Payer: Medicare HMO | Admitting: Cardiology

## 2020-07-05 ENCOUNTER — Encounter: Payer: Self-pay | Admitting: Cardiology

## 2020-07-05 ENCOUNTER — Other Ambulatory Visit: Payer: Self-pay

## 2020-07-05 VITALS — BP 140/80 | HR 93 | Ht 67.0 in | Wt 263.0 lb

## 2020-07-05 DIAGNOSIS — R0602 Shortness of breath: Secondary | ICD-10-CM

## 2020-07-05 DIAGNOSIS — I471 Supraventricular tachycardia: Secondary | ICD-10-CM

## 2020-07-05 DIAGNOSIS — R2689 Other abnormalities of gait and mobility: Secondary | ICD-10-CM | POA: Insufficient documentation

## 2020-07-05 DIAGNOSIS — I472 Ventricular tachycardia: Secondary | ICD-10-CM

## 2020-07-05 DIAGNOSIS — I4729 Other ventricular tachycardia: Secondary | ICD-10-CM

## 2020-07-05 NOTE — Patient Instructions (Signed)
Medication Instructions:  Your physician recommends that you continue on your current medications as directed. Please refer to the Current Medication list given to you today.  *If you need a refill on your cardiac medications before your next appointment, please call your pharmacy*   Lab Work: NONE If you have labs (blood work) drawn today and your tests are completely normal, you will receive your results only by: Marland Kitchen MyChart Message (if you have MyChart) OR . A paper copy in the mail If you have any lab test that is abnormal or we need to change your treatment, we will call you to review the results.   Testing/Procedures: EKG   Follow-Up: At [facility], you and your health needs are our priority.  As part of our continuing mission to provide you with exceptional heart care, we have created designated Provider Care Teams.  These Care Teams include your primary Cardiologist (physician) and Advanced Practice Providers (APPs -  Physician Assistants and Nurse Practitioners) who all work together to provide you with the care you need, when you need it.  We recommend signing up for the patient portal called "MyChart".  Sign up information is provided on this After Visit Summary.  MyChart is used to connect with patients for Virtual Visits (Telemedicine).  Patients are able to view lab/test results, encounter notes, upcoming appointments, etc.  Non-urgent messages can be sent to your provider as well.   To learn more about what you can do with MyChart, go to [email or web address].    Your next appointment:   3 month(s)  The format for your next appointment:   In Person  Provider:   Berniece Salines, DO   Other Instructions

## 2020-07-05 NOTE — Progress Notes (Signed)
Cardiology Office Note:    Date:  07/05/2020   ID:  Tonya Sanchez, DOB 1951-02-12, MRN [id number]  PCP:  Raynelle Jan., MD  Cardiologist:  Thomasene Ripple, DO  Electrophysiologist:  None   Referring MD: Raynelle Jan., MD   Chief Complaint  Patient presents with  . Follow-up    History of Present Illness:    Tonya Sanchez is a 70 y.o. female with a hx of hypertension, hyperlipidemia, morbid obesity is here today for follow-up visit.  Did see the patient back in May 2021 at that time we discussed her CT results which showed no evidence of coronary artery disease, her echocardiogram as well as a 0 monitor showing paroxysmal atrial tachycardia and NSVT.  Today she tells me that she has no palpitations that have resolved but she also has balance issues.  She is to follow-up with ENT and says she has not seen them in a while.  She notes that recently when she bent down to do things when she stands up she feels as if that she is going to lose her balance.  She has not fell or passed out from this. She still is short of breath she tells me.  But no chest pain  Past Medical History:  Diagnosis Date  . Achilles tendonitis, bilateral 02/04/2019  . Acute medial meniscus tear of right knee 09/23/2019   Formatting of this note might be different from the original. Added automatically from request for surgery [phone]  . Anxiety    occasional - no current med.  . Arthritis    "everywhere"  . Bladder tumor 05/03/2018  . Cervical spondylosis 04/02/2018  . Cervical spondylosis with radiculopathy 12/23/2017  . Cervical stenosis of spinal canal 12/23/2017   Added automatically from request for surgery [phone]  . Chronic pain syndrome 05/03/2018  . De Quervain's disease (tenosynovitis) 10/13/2017  . Diabetic peripheral neuropathy (HCC) 03/18/2019  . Essential hypertension 12/28/2017  . Fibromyalgia 05/03/2018  . Gastroesophageal reflux disease 05/06/2018  . Hand arthritis 01/08/2017  . History of DVT (deep  vein thrombosis) 1960s   no problems since  . History of recurrent UTIs 05/03/2018  . 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 Hypertension    under control with meds., has been on med. x 3-4 yr.  . Leg swelling 02/04/2019  . Lumbar facet arthropathy 08/19/2017  . Lumbar radiculopathy 03/18/2019  . Mass of hand 10/2012   left  . Metatarsal stress fracture, left, initial encounter 11/01/2014  . Moderate episode of recurrent major depressive disorder (HCC) 05/03/2018  . Morbid obesity (HCC) 02/04/2019  . NSVT (nonsustained ventricular tachycardia) (HCC) 08/05/2019  . Obesity (BMI 30.0-34.9) 05/03/2018  . Other hyperlipidemia 05/03/2018  . Pain, joint, ankle and foot, unspecified laterality 11/01/2014  . PAT (paroxysmal atrial tachycardia) (HCC) 08/05/2019  . Primary localized osteoarthrosis, ankle and foot 02/04/2019  . Primary osteoarthritis of first carpometacarpal joint of left hand 07/08/2019   Formatting of this note might be different from the original. Added automatically from request for surgery [phone]  . S/P cervical spinal fusion 04/02/2018  . Stenosing tenosynovitis of wrist 10/2012   left  . TIA (transient ischemic attack) 03/2010   no deficits  . Tremor, essential 10/21/2017  . Type 2 diabetes mellitus (HCC) 12/28/2017  . Wrist tendonitis 10/13/2017    Past Surgical History:  Procedure Laterality Date  . ABDOMINAL HYSTERECTOMY     partial  . ANTERIOR AND POSTERIOR VAGINAL REPAIR  04/07/2000   with vaginal vault  suspension  . ANTERIOR CERVICAL DECOMP/DISCECTOMY FUSION  10/20/2000   C5-6  . CARPAL TUNNEL RELEASE Right 04/12/2003  . CARPAL TUNNEL RELEASE Left 05/25/2003  . CARPAL TUNNEL RELEASE Left 10/27/2012   Procedure: LEFT WRIST STENOSISNG TENOSYNOVITIS RELEASE;  Surgeon: Schuyler Amor, MD;  Location: [facility];  Service: Orthopedics;  Laterality: Left;  . CHOLECYSTECTOMY    . CLOSED MANIPULATION KNEE WITH STERIOD INJECTION Left 01/27/2000  . CYSTOSCOPY WITH BIOPSY   04/01/2011   bladder bx. x 4  . EXAMINATION UNDER ANESTHESIA Left 01/27/2000   ankle - with steroid injection  . FOOT ARTHRODESIS, TRIPLE Bilateral   . FOOT GANGLION EXCISION Left 02/10/2002  . FOOT SURGERY Bilateral    spurs removed from both little toes  . KNEE ARTHROSCOPY Right   . PUBOVAGINAL SLING  04/07/2000  . TENDON RECONSTRUCTION Left    foot  . TOTAL KNEE ARTHROPLASTY Left 09/09/1999    Current Medications: Current Meds  Medication Sig  . amitriptyline (ELAVIL) 50 MG tablet TAKE 1 TABLET BY MOUTH EVERY DAY AT NIGHT  . aspirin 325 MG tablet Take 325 mg by mouth daily.  Marland Kitchen atorvastatin (LIPITOR) 10 MG tablet Take 1 tablet by mouth daily.  . Cholecalciferol 125 MCG (5000 UT) TABS Take 5,000 Units by mouth daily.  Marland Kitchen dicyclomine (BENTYL) 10 MG capsule   . Dulaglutide (TRULICITY) 3 0000000 SOPN Inject 3 mg into the skin once a week.  . esomeprazole (NEXIUM) 40 MG capsule Take 40 mg by mouth daily before breakfast.  . furosemide (LASIX) 40 MG tablet Take 1 tablet (40 mg total) by mouth daily.  Marland Kitchen glipiZIDE (GLUCOTROL XL) 5 MG 24 hr tablet Take 1 tablet (5 mg total) by mouth daily.  Marland Kitchen HYDROcodone-acetaminophen (NORCO/VICODIN) 5-325 MG tablet   . KLOR-CON M20 20 MEQ tablet TAKE 1 TABLET BY MOUTH EVERY DAY  . meloxicam (MOBIC) 15 MG tablet Take by mouth.  . metoprolol succinate (TOPROL XL) 25 MG 24 hr tablet Take 0.5 tablets (12.5 mg total) by mouth daily.  Marland Kitchen olmesartan (BENICAR) 40 MG tablet Take 40 mg by mouth daily.  . Venlafaxine HCl 225 MG TB24 Take 225 mg by mouth daily.     Allergies:   Keflex [cephalexin], Metformin hcl, and Percocet [oxycodone-acetaminophen]   Social History   Socioeconomic History  . Marital status: Married    Spouse name: Not on file  . Number of children: Not on file  . Years of education: Not on file  . Highest education level: Not on file  Occupational History  . Not on file  Tobacco Use  . Smoking status: Never Smoker  . Smokeless tobacco:  Never Used  Substance and Sexual Activity  . Alcohol use: No  . Drug use: No  . Sexual activity: Not on file  Other Topics Concern  . Not on file  Social History Narrative  . Not on file   Social Determinants of Health   Financial Resource Strain: Not on file  Food Insecurity: Not on file  Transportation Needs: Not on file  Physical Activity: Not on file  Stress: Not on file  Social Connections: Not on file     Family History: The patient's family history includes Breast cancer in her mother; COPD in her father; Diabetes in her mother; Emphysema in her brother; Heart disease in her brother and father; Sjogren's syndrome in her sister; Stroke in her brother, father, and sister.  ROS:   Review of Systems  Constitution: Negative for decreased  appetite, fever and weight gain.  HENT: Negative for congestion, ear discharge, hoarse voice and sore throat.   Eyes: Negative for discharge, redness, vision loss in right eye and visual halos.  Cardiovascular: Negative for chest pain, dyspnea on exertion, leg swelling, orthopnea and palpitations.  Respiratory: Negative for cough, hemoptysis, shortness of breath and snoring.   Endocrine: Negative for heat intolerance and polyphagia.  Hematologic/Lymphatic: Negative for bleeding problem. Does not bruise/bleed easily.  Skin: Negative for flushing, nail changes, rash and suspicious lesions.  Musculoskeletal: Negative for arthritis, joint pain, muscle cramps, myalgias, neck pain and stiffness.  Gastrointestinal: Negative for abdominal pain, bowel incontinence, diarrhea and excessive appetite.  Genitourinary: Negative for decreased libido, genital sores and incomplete emptying.  Neurological: Negative for brief paralysis, focal weakness, headaches and loss of balance.  Psychiatric/Behavioral: Negative for altered mental status, depression and suicidal ideas.  Allergic/Immunologic: Negative for HIV exposure and persistent infections.     EKGs/Labs/Other Studies Reviewed:    The following studies were reviewed today:   EKG:  The ekg ordered today demonstrates   Zio Monitor: The patient wore the monitor for 7 days starting July 07, 2019. Indication: Palpitations The minimum heart rate was 66 bpm, maximum heart rate was 214 bpm, and average heart rate was 89 bpm. Predominant underlying rhythm was Sinus Rhythm. Idioventricular Rhythm was present.   2 Ventricular Tachycardia runs occurred, the run with the fastest interval lasting 4 beats with a maximum rate of 214 bpm, the longest lasting 4 beats with an average rate of 107 bpm.   4 Supraventricular Tachycardia runs occurred, the run with the fastest interval lasting 5 beats with a maximum rate of 174 bpm, the longest lasting 7 beats with an average rate of 108 bpm.  Premature atrial complexes were rare (<1.0%). Premature Ventricular complexes were rare (<1.0%).  No pauses, No AV block and no atrial fibrillation present. 1 patient triggered event and 4 diary events are associated with sinus rhythm.  Conclusion: This study is remarkable for the following: 1. 2 runs of nonsustained ventricular tachycardia. 2. Paroxysmal supraventricular tachycardia, which is likely atrial tachycardia with variable block.  Echocardiogram IMPRESSIONS    1. Left ventricular ejection fraction, by estimation, is 60 to 65%. The  left ventricle has normal function. The left ventricle has no regional  wall motion abnormalities. Left ventricular diastolic parameters are  consistent with Grade I diastolic  dysfunction (impaired relaxation).   FINDINGS  Left Ventricle: Left ventricular ejection fraction, by estimation, is 60  to 65%. The left ventricle has normal function. The left ventricle has no  regional wall motion abnormalities. The left ventricular internal cavity  size was normal in size. There is  no left ventricular  hypertrophy. Left ventricular diastolic parameters  are consistent with Grade I diastolic dysfunction (impaired relaxation).   Right Ventricle: The right ventricular size is normal. No increase in  right ventricular wall thickness. Right ventricular systolic function is  normal. There is normal pulmonary artery systolic pressure. The tricuspid  regurgitant velocity is 2.13 m/s, and  with an assumed right atrial pressure of 10 mmHg, the estimated right  ventricular systolic pressure is 28.1 mmHg.   Recent Labs: 11/03/2019: BUN 15; Creatinine, Ser 1.05; Magnesium 2.2; Potassium 4.7; Sodium 139  Recent Lipid Panel    Component Value Date/Time   CHOL 109 08/01/2019 1531   TRIG 135 08/01/2019 1531   HDL 38 (L) 08/01/2019 1531   CHOLHDL 2.9 08/01/2019 1531   CHOLHDL 3.8 04/01/2010 0910  VLDL 30 04/01/2010 0910   LDLCALC 47 08/01/2019 1531    Physical Exam:    VS:  BP 140/80 (BP Location: Right Arm, Patient Position: Sitting, Cuff Size: Large)   Pulse 93   Ht 5\' 7"  (1.702 m)   Wt 263 lb (119.3 kg)   SpO2 95%   BMI 41.19 kg/m     Wt Readings from Last 3 Encounters:  07/05/20 263 lb (119.3 kg)  03/29/20 257 lb 8 oz (116.8 kg)  11/03/19 254 lb (115.2 kg)     GEN: Well nourished, well developed in no acute distress HEENT: Normal NECK: No JVD; No carotid bruits LYMPHATICS: No lymphadenopathy CARDIAC: S1S2 noted,RRR, no murmurs, rubs, gallops RESPIRATORY:  Clear to auscultation without rales, wheezing or rhonchi  ABDOMEN: Soft, non-tender, non-distended, +bowel sounds, no guarding. EXTREMITIES: No edema, No cyanosis, no clubbing MUSCULOSKELETAL:  No deformity  SKIN: Warm and dry NEUROLOGIC:  Alert and oriented x 3, non-focal PSYCHIATRIC:  Normal affect, good insight  ASSESSMENT:    1. Shortness of breath   2. Loss of balance   3. PAT (paroxysmal atrial tachycardia) ([place])   4. NSVT (nonsustained ventricular tachycardia) ([place])   5. Morbid obesity ([place])    PLAN:      1.  I do believe her shortness of breath is multifactorial in the setting of diastolic dysfunction as well as morbid obesity and deconditioning she is planning to increase her exercise activity and we will going to monitor the patient.  Her echocardiogram does not show any elevated right-sided pressure.  I advised the patient that it will be beneficial if she can increase her activity to see if we have any improvement.  She is agreeable.  She did see pulmonary and at this time there is no reported pulmonary pathology.  If she is gotten back to her good exercise tolerance and still short of breath we will consider right heart catheterization or a cardiopulmonary function testing.  2. the patient understands the need to lose weight with diet and exercise. We have discussed specific strategies for this.  3. blood pressure is acceptable, continue with current antihypertensive regimen which includes  4. continue patient on current beta-blocker dose.  The patient is in agreement with the above plan. The patient left the office in stable condition.  The patient will follow up in   Medication Adjustments/Labs and Tests Ordered: Current medicines are reviewed at length with the patient today.  Concerns regarding medicines are outlined above.  Orders Placed This Encounter  Procedures  . EKG 12-Lead   No orders of the defined types were placed in this encounter.   Patient Instructions  Medication Instructions:  Your physician recommends that you continue on your current medications as directed. Please refer to the Current Medication list given to you today.  *If you need a refill on your cardiac medications before your next appointment, please call your pharmacy*   Lab Work: NONE If you have labs (blood work) drawn today and your tests are completely normal, you will receive your results only by: Marland Kitchen MyChart Message (if you have MyChart) OR . A paper copy in the mail If you have any lab test  that is abnormal or we need to change your treatment, we will call you to review the results.   Testing/Procedures: EKG   Follow-Up: At [facility], you and your health needs are our priority.  As part of our continuing mission to provide you with exceptional heart care, we have created designated  Provider Care Teams.  These Care Teams include your primary Cardiologist (physician) and Advanced Practice Providers (APPs -  Physician Assistants and Nurse Practitioners) who all work together to provide you with the care you need, when you need it.  We recommend signing up for the patient portal called "MyChart".  Sign up information is provided on this After Visit Summary.  MyChart is used to connect with patients for Virtual Visits (Telemedicine).  Patients are able to view lab/test results, encounter notes, upcoming appointments, etc.  Non-urgent messages can be sent to your provider as well.   To learn more about what you can do with MyChart, go to [email or web address].    Your next appointment:   3 month(s)  The format for your next appointment:   In Person  Provider:   Berniece Salines, DO   Other Instructions      Adopting a Healthy Lifestyle.  Know what a healthy weight is for you (roughly BMI <25) and aim to maintain this   Aim for 7+ servings of fruits and vegetables daily   65-80+ fluid ounces of water or unsweet tea for healthy kidneys   Limit to max 1 drink of alcohol per day; avoid smoking/tobacco   Limit animal fats in diet for cholesterol and heart health - choose grass fed whenever available   Avoid highly processed foods, and foods high in saturated/trans fats   Aim for low stress - take time to unwind and care for your mental health   Aim for 150 min of moderate intensity exercise weekly for heart health, and weights twice weekly for bone health   Aim for 7-9 hours of sleep daily   When it comes to diets, agreement about the perfect plan isnt easy to  find, even among the experts. Experts at the [employer] developed an idea known as the Healthy Eating Plate. Just imagine a plate divided into logical, healthy portions.   The emphasis is on diet quality:   Load up on vegetables and fruits - one-half of your plate: Aim for color and variety, and remember that potatoes dont count.   Go for whole grains - one-quarter of your plate: Whole wheat, barley, wheat berries, quinoa, oats, brown rice, and foods made with them. If you want pasta, go with whole wheat pasta.   Protein power - one-quarter of your plate: Fish, chicken, beans, and nuts are all healthy, versatile protein sources. Limit red meat.   The diet, however, does go beyond the plate, offering a few other suggestions.   Use healthy plant oils, such as olive, canola, soy, corn, sunflower and peanut. Check the labels, and avoid partially hydrogenated oil, which have unhealthy trans fats.   If youre thirsty, drink water. Coffee and tea are good in moderation, but skip sugary drinks and limit milk and dairy products to one or two daily servings.   The type of carbohydrate in the diet is more important than the amount. Some sources of carbohydrates, such as vegetables, fruits, whole grains, and beans-are healthier than others.   Finally, stay active  Signed, Berniece Salines, DO  07/05/2020 1:55 PM    [facility]

## 2020-07-11 ENCOUNTER — Ambulatory Visit: Payer: Medicare HMO | Admitting: Internal Medicine

## 2020-10-04 ENCOUNTER — Ambulatory Visit: Payer: Medicare HMO | Admitting: Cardiology

## 2020-10-23 ENCOUNTER — Encounter: Payer: Self-pay | Admitting: Cardiology

## 2020-10-23 ENCOUNTER — Other Ambulatory Visit: Payer: Self-pay

## 2020-10-23 ENCOUNTER — Ambulatory Visit: Payer: Medicare HMO | Admitting: Cardiology

## 2020-10-23 VITALS — BP 104/64 | HR 86 | Ht 67.0 in | Wt 260.8 lb

## 2020-10-23 DIAGNOSIS — G459 Transient cerebral ischemic attack, unspecified: Secondary | ICD-10-CM | POA: Diagnosis not present

## 2020-10-23 DIAGNOSIS — I4729 Other ventricular tachycardia: Secondary | ICD-10-CM

## 2020-10-23 DIAGNOSIS — I10 Essential (primary) hypertension: Secondary | ICD-10-CM | POA: Diagnosis not present

## 2020-10-23 DIAGNOSIS — E119 Type 2 diabetes mellitus without complications: Secondary | ICD-10-CM

## 2020-10-23 DIAGNOSIS — I4719 Other supraventricular tachycardia: Secondary | ICD-10-CM

## 2020-10-23 DIAGNOSIS — I472 Ventricular tachycardia: Secondary | ICD-10-CM | POA: Diagnosis not present

## 2020-10-23 DIAGNOSIS — I471 Supraventricular tachycardia: Secondary | ICD-10-CM | POA: Diagnosis not present

## 2020-10-23 NOTE — Patient Instructions (Addendum)
Medication Instructions:  Your physician recommends that you continue on your current medications as directed. Please refer to the Current Medication list given to you today.  *If you need a refill on your cardiac medications before your next appointment, please call your pharmacy*   Lab Work: Your physician recommends that you return for lab work: NEXT WEEK: BMET, Mag, CBC If you have labs (blood work) drawn today and your tests are completely normal, you will receive your results only by: Marland Kitchen MyChart Message (if you have MyChart) OR . A paper copy in the mail If you have any lab test that is abnormal or we need to change your treatment, we will call you to review the results.   Testing/Procedures:    [place] [id number] Dept: [phone] Loc: [phone]  SENYA HINZMAN  10/23/2020  You are scheduled for a Cardiac Catheterization on Friday, May 13 with Dr. Daneen Schick.  1. Please arrive at the [facility] (Main Entrance A) at [facility]: [address] [place], [place] [address] at 5:30 AM (This time is two hours before your procedure to ensure your preparation). Free valet parking service is available.   Special note: Every effort is made to have your procedure done on time. Please understand that emergencies sometimes delay scheduled procedures.  2. Diet: Do not eat solid foods after midnight.  The patient may have clear liquids until 5am upon the day of the procedure.  3. Labs: You will need to have blood drawn on NEXT WEEK  4. Medication instructions in preparation for your procedure:   Contrast Allergy: No  On the morning of your procedure, take your Aspirin and any morning medicines NOT listed above.  You may use sips of water.  5. Plan for one night stay--bring personal belongings. 6. Bring a current list of your medications and current insurance  cards. 7. You MUST have a responsible person to drive you home. 8. Someone MUST be with you the first 24 hours after you arrive home or your discharge will be delayed. 9. Please wear clothes that are easy to get on and off and wear slip-on shoes.  Thank you for allowing [place] to care for you!   -- [facility]    Follow-Up: At [facility], you and your health needs are our priority.  As part of our continuing mission to provide you with exceptional heart care, we have created designated Provider Care Teams.  These Care Teams include your primary Cardiologist (physician) and Advanced Practice Providers (APPs -  Physician Assistants and Nurse Practitioners) who all work together to provide you with the care you need, when you need it.  We recommend signing up for the patient portal called "MyChart".  Sign up information is provided on this After Visit Summary.  MyChart is used to connect with patients for Virtual Visits (Telemedicine).  Patients are able to view lab/test results, encounter notes, upcoming appointments, etc.  Non-urgent messages can be sent to your provider as well.   To learn more about what you can do with MyChart, go to [email or web address].    Your next appointment:   2 week(s) after Cath  The format for your next appointment:   In Person  Provider:   Berniece Salines, DO   Other Instructions

## 2020-10-23 NOTE — Progress Notes (Signed)
Cardiology Office Note:    Date:  10/23/2020   ID:  Tonya Sanchez, DOB [birth year], MRN [id number]  PCP:  Verdell Carmine., MD  Cardiologist:  Berniece Salines, DO  Electrophysiologist:  None   Referring MD: Verdell Carmine., MD   I am still very short of breath  History of Present Illness:    Tonya Sanchez is a 70 y.o. female with a hx of hypertension, diabetes mellitus, family history of premature coronary artery disease in her brother in his early 63s hyperlipidemia is here today for follow-up visit.   Did see the patient back in May 2021 at that time we discussed her CT results which showed no evidence of coronary artery disease, her echocardiogram as well as a 0 monitor showing paroxysmal atrial tachycardia and NSVT.  In January 2022 she presented for follow-up visit.  She still short of breath.  We talked about deconditioning as well as the possibility of shortness of breath due to diastolic dysfunction.  She was going to increase her exercise/activity.  She also did see pulmonary with no report of any pulmonary pathology that could be causing her shortness of breath.  Is here today for follow-up visit.  She still is experiencing shortness of breath.   Past Medical History:  Diagnosis Date  . Achilles tendonitis, bilateral 02/04/2019  . Acute medial meniscus tear of right knee 09/23/2019   Formatting of this note might be different from the original. Added automatically from request for surgery [phone]  . Anxiety    occasional - no current med.  . Arthritis    "everywhere"  . Bladder tumor 05/03/2018  . Cervical spondylosis 04/02/2018  . Cervical spondylosis with radiculopathy 12/23/2017  . Cervical stenosis of spinal canal 12/23/2017   Added automatically from request for surgery [phone]  . Chronic pain syndrome 05/03/2018  . De Quervain's disease (tenosynovitis) 10/13/2017  . Diabetic peripheral neuropathy ([place]) 03/18/2019  . Essential hypertension 12/28/2017  . Fibromyalgia 05/03/2018   . Gastroesophageal reflux disease 05/06/2018  . Hand arthritis 01/08/2017  . History of DVT (deep vein thrombosis) 1960s   no problems since  . History of recurrent UTIs 05/03/2018  . 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 Hypertension    under control with meds., has been on med. x 3-4 yr.  . Leg swelling 02/04/2019  . Lumbar facet arthropathy 08/19/2017  . Lumbar radiculopathy 03/18/2019  . Mass of hand 10/2012   left  . Metatarsal stress fracture, left, initial encounter 11/01/2014  . Moderate episode of recurrent major depressive disorder ([place]) 05/03/2018  . Morbid obesity ([place]) 02/04/2019  . NSVT (nonsustained ventricular tachycardia) ([place]) 08/05/2019  . Obesity (BMI 30.0-34.9) 05/03/2018  . Other hyperlipidemia 05/03/2018  . Pain, joint, ankle and foot, unspecified laterality 11/01/2014  . PAT (paroxysmal atrial tachycardia) ([place]) 08/05/2019  . Primary localized osteoarthrosis, ankle and foot 02/04/2019  . Primary osteoarthritis of first carpometacarpal joint of left hand 07/08/2019   Formatting of this note might be different from the original. Added automatically from request for surgery [phone]  . S/P cervical spinal fusion 04/02/2018  . Stenosing tenosynovitis of wrist 10/2012   left  . TIA (transient ischemic attack) 03/2010   no deficits  . Tremor, essential 10/21/2017  . Type 2 diabetes mellitus ([place]) 12/28/2017  . Wrist tendonitis 10/13/2017    Past Surgical History:  Procedure Laterality Date  . ABDOMINAL HYSTERECTOMY     partial  . ANTERIOR AND POSTERIOR VAGINAL REPAIR  04/07/2000   with vaginal  vault suspension  . ANTERIOR CERVICAL DECOMP/DISCECTOMY FUSION  10/20/2000   C5-6  . CARPAL TUNNEL RELEASE Right 04/12/2003  . CARPAL TUNNEL RELEASE Left 05/25/2003  . CARPAL TUNNEL RELEASE Left 10/27/2012   Procedure: LEFT WRIST STENOSISNG TENOSYNOVITIS RELEASE;  Surgeon: Schuyler Amor, MD;  Location: [facility];  Service: Orthopedics;  Laterality: Left;  .  CHOLECYSTECTOMY    . CLOSED MANIPULATION KNEE WITH STERIOD INJECTION Left 01/27/2000  . CYSTOSCOPY WITH BIOPSY  04/01/2011   bladder bx. x 4  . EXAMINATION UNDER ANESTHESIA Left 01/27/2000   ankle - with steroid injection  . FOOT ARTHRODESIS, TRIPLE Bilateral   . FOOT GANGLION EXCISION Left 02/10/2002  . FOOT SURGERY Bilateral    spurs removed from both little toes  . KNEE ARTHROSCOPY Right   . PUBOVAGINAL SLING  04/07/2000  . TENDON RECONSTRUCTION Left    foot  . TOTAL KNEE ARTHROPLASTY Left 09/09/1999    Current Medications: Current Meds  Medication Sig  . amitriptyline (ELAVIL) 50 MG tablet TAKE 1 TABLET BY MOUTH EVERY DAY AT NIGHT  . aspirin 325 MG tablet Take 325 mg by mouth daily.  Marland Kitchen atorvastatin (LIPITOR) 10 MG tablet Take 1 tablet by mouth daily.  . Cholecalciferol 125 MCG (5000 UT) TABS Take 5,000 Units by mouth daily.  Marland Kitchen dicyclomine (BENTYL) 10 MG capsule   . Dulaglutide (TRULICITY) 3 0000000 SOPN Inject 3 mg into the skin once a week.  . esomeprazole (NEXIUM) 40 MG capsule Take 40 mg by mouth daily before breakfast.  . furosemide (LASIX) 40 MG tablet Take 1 tablet (40 mg total) by mouth daily.  Marland Kitchen glipiZIDE (GLUCOTROL XL) 5 MG 24 hr tablet Take 1 tablet (5 mg total) by mouth daily.  Marland Kitchen KLOR-CON M20 20 MEQ tablet TAKE 1 TABLET BY MOUTH EVERY DAY  . meloxicam (MOBIC) 15 MG tablet Take 15 mg by mouth daily.  Marland Kitchen olmesartan (BENICAR) 40 MG tablet Take 40 mg by mouth daily.  . Venlafaxine HCl 225 MG TB24 Take 225 mg by mouth daily.  . [DISCONTINUED] HYDROcodone-acetaminophen (NORCO/VICODIN) 5-325 MG tablet   . [DISCONTINUED] metoprolol succinate (TOPROL XL) 25 MG 24 hr tablet Take 0.5 tablets (12.5 mg total) by mouth daily.     Allergies:   Keflex [cephalexin], Metformin hcl, and Percocet [oxycodone-acetaminophen]   Social History   Socioeconomic History  . Marital status: Married    Spouse name: Not on file  . Number of children: Not on file  . Years of education: Not on  file  . Highest education level: Not on file  Occupational History  . Not on file  Tobacco Use  . Smoking status: Never Smoker  . Smokeless tobacco: Never Used  Substance and Sexual Activity  . Alcohol use: No  . Drug use: No  . Sexual activity: Not on file  Other Topics Concern  . Not on file  Social History Narrative  . Not on file   Social Determinants of Health   Financial Resource Strain: Not on file  Food Insecurity: Not on file  Transportation Needs: Not on file  Physical Activity: Not on file  Stress: Not on file  Social Connections: Not on file     Family History: The patient's family history includes Breast cancer in her mother; COPD in her father; Diabetes in her mother; Emphysema in her brother; Heart disease in her brother and father; Sjogren's syndrome in her sister; Stroke in her brother, father, and sister.  ROS:   Review of  Systems  Constitution: Negative for decreased appetite, fever and weight gain.  HENT: Negative for congestion, ear discharge, hoarse voice and sore throat.   Eyes: Negative for discharge, redness, vision loss in right eye and visual halos.  Cardiovascular: Negative for chest pain, dyspnea on exertion, leg swelling, orthopnea and palpitations.  Respiratory: Negative for cough, hemoptysis, shortness of breath and snoring.   Endocrine: Negative for heat intolerance and polyphagia.  Hematologic/Lymphatic: Negative for bleeding problem. Does not bruise/bleed easily.  Skin: Negative for flushing, nail changes, rash and suspicious lesions.  Musculoskeletal: Negative for arthritis, joint pain, muscle cramps, myalgias, neck pain and stiffness.  Gastrointestinal: Negative for abdominal pain, bowel incontinence, diarrhea and excessive appetite.  Genitourinary: Negative for decreased libido, genital sores and incomplete emptying.  Neurological: Negative for brief paralysis, focal weakness, headaches and loss of balance.  Psychiatric/Behavioral:  Negative for altered mental status, depression and suicidal ideas.  Allergic/Immunologic: Negative for HIV exposure and persistent infections.    EKGs/Labs/Other Studies Reviewed:    The following studies were reviewed today:   EKG: None today  Coronary CTA February 2021 Aorta:  Normal size.  Mild aortic atherosclerosis.  No dissection.  Aortic Valve: Trileaflet. Mild basal leaflet calcifications. Annular mild calcification adjacent to LM ostium.  Coronary Arteries:  Normal coronary origin.  Left dominance.  RCA is a small non-dominant artery.  There is no plaque.  Left main is a large artery that gives rise to LAD and LCX arteries.  LAD is a large vessel that has no plaque. There are mild luminal irregularities. 3 fairly small diagonal branches.  LCX is a dominant artery that gives rise to one large OM1 branch and small PDA. There is no plaque.  Other findings:  Normal pulmonary vein drainage into the left atrium.  Normal left atrial appendage without a thrombus.  Normal size of the pulmonary artery.  IMPRESSION: 1. Coronary calcium score of 0. This was 0 percentile for age and sex matched control.  2. Normal coronary origin with LEFT dominance (small PDA).  3. No evidence of flow limiting CAD.  4. Aortic atherosclerosis.  Candee Furbish, MD [facility]  Zio Monitor: The patient wore the monitor for 7 days starting July 07, 2019. Indication: Palpitations The minimum heart rate was 66 bpm, maximum heart rate was 214 bpm, and average heart rate was 89 bpm. Predominant underlying rhythm was Sinus Rhythm. Idioventricular Rhythm was present.   2 Ventricular Tachycardia runs occurred, the run with the fastest interval lasting 4 beats with a maximum rate of 214 bpm, the longest lasting 4 beats with an average rate of 107 bpm.   4 Supraventricular Tachycardia runs occurred, the run with the fastest interval lasting 5 beats with a maximum rate of 174 bpm,  the longest lasting 7 beats with an average rate of 108 bpm.  Premature atrial complexes were rare (<1.0%). Premature Ventricular complexes were rare (<1.0%).  No pauses, No AV block and no atrial fibrillation present. 1 patient triggered event and 4 diary events are associated with sinus rhythm.  Conclusion: This study is remarkable for the following: 1. 2 runs of nonsustained ventricular tachycardia. 2. Paroxysmal supraventricular tachycardia, which is likely atrial tachycardia with variable block.  Echocardiogram March 2021 IMPRESSIONS  1. Left ventricular ejection fraction, by estimation, is 60 to 65%. The  left ventricle has normal function. The left ventricle has no regional  wall motion abnormalities. Left ventricular diastolic parameters are  consistent with Grade I diastolic  dysfunction (impaired relaxation).   FINDINGS  Left Ventricle: Left ventricular ejection fraction, by estimation, is 60 to 65%. The left ventricle has normal function. The left ventricle has no regional wall motion abnormalities. The left ventricular internal cavity  size was normal in size. There is no left ventricular hypertrophy. Left ventricular diastolic parameters are consistent with Grade I diastolic dysfunction (impaired relaxation).   Right Ventricle: The right ventricular size is normal. No increase in right ventricular wall thickness. Right ventricular systolic function is normal. There is normal pulmonary artery systolic pressure. The tricuspid regurgitant velocity is 2.13 m/s, and with an assumed right atrial pressure of 10 mmHg, the estimated right  ventricular systolic pressure is [id number] mmHg.   Recent Labs: 11/03/2019: BUN 15; Creatinine, Ser 1.05; Magnesium 2.2; Potassium 4.7; Sodium 139  Recent Lipid Panel    Component Value Date/Time   CHOL 109 08/01/2019 1531   TRIG 135 08/01/2019 1531   HDL 38 (L) 08/01/2019 1531   CHOLHDL 2.9  08/01/2019 1531   CHOLHDL 3.8 04/01/2010 0910   VLDL 30 04/01/2010 0910   LDLCALC 47 08/01/2019 1531    Physical Exam:    VS:  BP 104/64   Pulse 86   Ht 5\' 7"  (1.702 m)   Wt 260 lb 12.8 oz (118.3 kg)   SpO2 98%   BMI 40.85 kg/m     Wt Readings from Last 3 Encounters:  10/23/20 260 lb 12.8 oz (118.3 kg)  07/05/20 263 lb (119.3 kg)  03/29/20 257 lb 8 oz (116.8 kg)     GEN: Well nourished, well developed in no acute distress HEENT: Normal NECK: No JVD; No carotid bruits LYMPHATICS: No lymphadenopathy CARDIAC: S1S2 noted,RRR, no murmurs, rubs, gallops RESPIRATORY:  Clear to auscultation without rales, wheezing or rhonchi  ABDOMEN: Soft, non-tender, non-distended, +bowel sounds, no guarding. EXTREMITIES: No edema, No cyanosis, no clubbing MUSCULOSKELETAL:  No deformity  SKIN: Warm and dry NEUROLOGIC:  Alert and oriented x 3, non-focal PSYCHIATRIC:  Normal affect, good insight  ASSESSMENT:    1. Essential hypertension   2. PAT (paroxysmal atrial tachycardia) ([place])   3. NSVT (nonsustained ventricular tachycardia) ([place])   4. TIA (transient ischemic attack)   5. Hypertension, unspecified type   6. Morbid obesity ([place])   7. Type 2 diabetes mellitus without complication, without long-term current use of insulin (H[facility])    PLAN:    At this time with her shortness of breath I like to proceed with a right heart catheterization in this patient.  She has had a coronary CTA that does not show any coronary artery disease, her echo does not show any right sided elevated pressure.  She has diastolic dysfunction.  I think a right heart catheterization is in order where I can be able to get another assessment of her right-sided heart pressure as well as her LVEDP.  We will continue to hold her diuretics for now and other medication.  The patient understands that risks include but are not limited to stroke (1 in 1000), death (1 in 49), kidney failure [usually temporary] (1 in 500), bleeding  (1 in 200), allergic reaction [possibly serious] (1 in 200), and agrees to proceed.  I explained to the patient that weight loss may also help with her condition as deconditioning may be playing a role here.  The patient understands the need to lose weight with diet and exercise. We have discussed specific strategies for this.  This is being managed by his primary care doctor.  No adjustments for antidiabetic medications were made today.  The patient is in agreement with the above plan. The patient left the office in stable condition.  The patient will follow up in   Medication Adjustments/Labs and Tests Ordered: Current medicines are reviewed at length with the patient today.  Concerns regarding medicines are outlined above.  No orders of the defined types were placed in this encounter.  No orders of the defined types were placed in this encounter.   There are no Patient Instructions on file for this visit.   Adopting a Healthy Lifestyle.  Know what a healthy weight is for you (roughly BMI <25) and aim to maintain this   Aim for 7+ servings of fruits and vegetables daily   65-80+ fluid ounces of water or unsweet tea for healthy kidneys   Limit to max 1 drink of alcohol per day; avoid smoking/tobacco   Limit animal fats in diet for cholesterol and heart health - choose grass fed whenever available   Avoid highly processed foods, and foods high in saturated/trans fats   Aim for low stress - take time to unwind and care for your mental health   Aim for 150 min of moderate intensity exercise weekly for heart health, and weights twice weekly for bone health   Aim for 7-9 hours of sleep daily   When it comes to diets, agreement about the perfect plan isnt easy to find, even among the experts. Experts at the [place] developed an idea known as the Healthy Eating Plate. Just imagine a plate divided into logical, healthy portions.   The emphasis is on  diet quality:   Load up on vegetables and fruits - one-half of your plate: Aim for color and variety, and remember that potatoes dont count.   Go for whole grains - one-quarter of your plate: Whole wheat, barley, wheat berries, quinoa, oats, brown rice, and foods made with them. If you want pasta, go with whole wheat pasta.   Protein power - one-quarter of your plate: Fish, chicken, beans, and nuts are all healthy, versatile protein sources. Limit red meat.   The diet, however, does go beyond the plate, offering a few other suggestions.   Use healthy plant oils, such as olive, canola, soy, corn, sunflower and peanut. Check the labels, and avoid partially hydrogenated oil, which have unhealthy trans fats.   If youre thirsty, drink water. Coffee and tea are good in moderation, but skip sugary drinks and limit milk and dairy products to one or two daily servings.   The type of carbohydrate in the diet is more important than the amount. Some sources of carbohydrates, such as vegetables, fruits, whole grains, and beans-are healthier than others.   Finally, stay active  Signed, Berniece Salines, DO  10/23/2020 1:57 PM    [facility]

## 2020-10-23 NOTE — H&P (View-Only) (Signed)
Cardiology Office Note:    Date:  10/23/2020   ID:  Tonya Sanchez, DOB [birth year], MRN [id number]  PCP:  Verdell Carmine., MD  Cardiologist:  Berniece Salines, DO  Electrophysiologist:  None   Referring MD: Verdell Carmine., MD   I am still very short of breath  History of Present Illness:    Tonya Sanchez is a 70 y.o. female with a hx of hypertension, diabetes mellitus, family history of premature coronary artery disease in her brother in his early 63s hyperlipidemia is here today for follow-up visit.   Did see the patient back in May 2021 at that time we discussed her CT results which showed no evidence of coronary artery disease, her echocardiogram as well as a 0 monitor showing paroxysmal atrial tachycardia and NSVT.  In January 2022 she presented for follow-up visit.  She still short of breath.  We talked about deconditioning as well as the possibility of shortness of breath due to diastolic dysfunction.  She was going to increase her exercise/activity.  She also did see pulmonary with no report of any pulmonary pathology that could be causing her shortness of breath.  Is here today for follow-up visit.  She still is experiencing shortness of breath.   Past Medical History:  Diagnosis Date  . Achilles tendonitis, bilateral 02/04/2019  . Acute medial meniscus tear of right knee 09/23/2019   Formatting of this note might be different from the original. Added automatically from request for surgery [phone]  . Anxiety    occasional - no current med.  . Arthritis    "everywhere"  . Bladder tumor 05/03/2018  . Cervical spondylosis 04/02/2018  . Cervical spondylosis with radiculopathy 12/23/2017  . Cervical stenosis of spinal canal 12/23/2017   Added automatically from request for surgery [phone]  . Chronic pain syndrome 05/03/2018  . De Quervain's disease (tenosynovitis) 10/13/2017  . Diabetic peripheral neuropathy ([place]) 03/18/2019  . Essential hypertension 12/28/2017  . Fibromyalgia 05/03/2018   . Gastroesophageal reflux disease 05/06/2018  . Hand arthritis 01/08/2017  . History of DVT (deep vein thrombosis) 1960s   no problems since  . History of recurrent UTIs 05/03/2018  . 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 Hypertension    under control with meds., has been on med. x 3-4 yr.  . Leg swelling 02/04/2019  . Lumbar facet arthropathy 08/19/2017  . Lumbar radiculopathy 03/18/2019  . Mass of hand 10/2012   left  . Metatarsal stress fracture, left, initial encounter 11/01/2014  . Moderate episode of recurrent major depressive disorder ([place]) 05/03/2018  . Morbid obesity ([place]) 02/04/2019  . NSVT (nonsustained ventricular tachycardia) ([place]) 08/05/2019  . Obesity (BMI 30.0-34.9) 05/03/2018  . Other hyperlipidemia 05/03/2018  . Pain, joint, ankle and foot, unspecified laterality 11/01/2014  . PAT (paroxysmal atrial tachycardia) ([place]) 08/05/2019  . Primary localized osteoarthrosis, ankle and foot 02/04/2019  . Primary osteoarthritis of first carpometacarpal joint of left hand 07/08/2019   Formatting of this note might be different from the original. Added automatically from request for surgery [phone]  . S/P cervical spinal fusion 04/02/2018  . Stenosing tenosynovitis of wrist 10/2012   left  . TIA (transient ischemic attack) 03/2010   no deficits  . Tremor, essential 10/21/2017  . Type 2 diabetes mellitus ([place]) 12/28/2017  . Wrist tendonitis 10/13/2017    Past Surgical History:  Procedure Laterality Date  . ABDOMINAL HYSTERECTOMY     partial  . ANTERIOR AND POSTERIOR VAGINAL REPAIR  04/07/2000   with vaginal  vault suspension  . ANTERIOR CERVICAL DECOMP/DISCECTOMY FUSION  10/20/2000   C5-6  . CARPAL TUNNEL RELEASE Right 04/12/2003  . CARPAL TUNNEL RELEASE Left 05/25/2003  . CARPAL TUNNEL RELEASE Left 10/27/2012   Procedure: LEFT WRIST STENOSISNG TENOSYNOVITIS RELEASE;  Surgeon: Matthew A Weingold, MD;  Location: [facility];  Service: Orthopedics;  Laterality: Left;  .  CHOLECYSTECTOMY    . CLOSED MANIPULATION KNEE WITH STERIOD INJECTION Left 01/27/2000  . CYSTOSCOPY WITH BIOPSY  04/01/2011   bladder bx. x 4  . EXAMINATION UNDER ANESTHESIA Left 01/27/2000   ankle - with steroid injection  . FOOT ARTHRODESIS, TRIPLE Bilateral   . FOOT GANGLION EXCISION Left 02/10/2002  . FOOT SURGERY Bilateral    spurs removed from both little toes  . KNEE ARTHROSCOPY Right   . PUBOVAGINAL SLING  04/07/2000  . TENDON RECONSTRUCTION Left    foot  . TOTAL KNEE ARTHROPLASTY Left 09/09/1999    Current Medications: Current Meds  Medication Sig  . amitriptyline (ELAVIL) 50 MG tablet TAKE 1 TABLET BY MOUTH EVERY DAY AT NIGHT  . aspirin 325 MG tablet Take 325 mg by mouth daily.  . atorvastatin (LIPITOR) 10 MG tablet Take 1 tablet by mouth daily.  . Cholecalciferol 125 MCG (5000 UT) TABS Take 5,000 Units by mouth daily.  . dicyclomine (BENTYL) 10 MG capsule   . Dulaglutide (TRULICITY) 3 MG/0.5ML SOPN Inject 3 mg into the skin once a week.  . esomeprazole (NEXIUM) 40 MG capsule Take 40 mg by mouth daily before breakfast.  . furosemide (LASIX) 40 MG tablet Take 1 tablet (40 mg total) by mouth daily.  . glipiZIDE (GLUCOTROL XL) 5 MG 24 hr tablet Take 1 tablet (5 mg total) by mouth daily.  . KLOR-CON M20 20 MEQ tablet TAKE 1 TABLET BY MOUTH EVERY DAY  . meloxicam (MOBIC) 15 MG tablet Take 15 mg by mouth daily.  . olmesartan (BENICAR) 40 MG tablet Take 40 mg by mouth daily.  . Venlafaxine HCl 225 MG TB24 Take 225 mg by mouth daily.  . [DISCONTINUED] HYDROcodone-acetaminophen (NORCO/VICODIN) 5-325 MG tablet   . [DISCONTINUED] metoprolol succinate (TOPROL XL) 25 MG 24 hr tablet Take 0.5 tablets (12.5 mg total) by mouth daily.     Allergies:   Keflex [cephalexin], Metformin hcl, and Percocet [oxycodone-acetaminophen]   Social History   Socioeconomic History  . Marital status: Married    Spouse name: Not on file  . Number of children: Not on file  . Years of education: Not on  file  . Highest education level: Not on file  Occupational History  . Not on file  Tobacco Use  . Smoking status: Never Smoker  . Smokeless tobacco: Never Used  Substance and Sexual Activity  . Alcohol use: No  . Drug use: No  . Sexual activity: Not on file  Other Topics Concern  . Not on file  Social History Narrative  . Not on file   Social Determinants of Health   Financial Resource Strain: Not on file  Food Insecurity: Not on file  Transportation Needs: Not on file  Physical Activity: Not on file  Stress: Not on file  Social Connections: Not on file     Family History: The patient's family history includes Breast cancer in her mother; COPD in her father; Diabetes in her mother; Emphysema in her brother; Heart disease in her brother and father; Sjogren's syndrome in her sister; Stroke in her brother, father, and sister.  ROS:   Review of   Systems  Constitution: Negative for decreased appetite, fever and weight gain.  HENT: Negative for congestion, ear discharge, hoarse voice and sore throat.   Eyes: Negative for discharge, redness, vision loss in right eye and visual halos.  Cardiovascular: Negative for chest pain, dyspnea on exertion, leg swelling, orthopnea and palpitations.  Respiratory: Negative for cough, hemoptysis, shortness of breath and snoring.   Endocrine: Negative for heat intolerance and polyphagia.  Hematologic/Lymphatic: Negative for bleeding problem. Does not bruise/bleed easily.  Skin: Negative for flushing, nail changes, rash and suspicious lesions.  Musculoskeletal: Negative for arthritis, joint pain, muscle cramps, myalgias, neck pain and stiffness.  Gastrointestinal: Negative for abdominal pain, bowel incontinence, diarrhea and excessive appetite.  Genitourinary: Negative for decreased libido, genital sores and incomplete emptying.  Neurological: Negative for brief paralysis, focal weakness, headaches and loss of balance.  Psychiatric/Behavioral:  Negative for altered mental status, depression and suicidal ideas.  Allergic/Immunologic: Negative for HIV exposure and persistent infections.    EKGs/Labs/Other Studies Reviewed:    The following studies were reviewed today:   EKG: None today  Coronary CTA February 2021 Aorta:  Normal size.  Mild aortic atherosclerosis.  No dissection.  Aortic Valve: Trileaflet. Mild basal leaflet calcifications. Annular mild calcification adjacent to LM ostium.  Coronary Arteries:  Normal coronary origin.  Left dominance.  RCA is a small non-dominant artery.  There is no plaque.  Left main is a large artery that gives rise to LAD and LCX arteries.  LAD is a large vessel that has no plaque. There are mild luminal irregularities. 3 fairly small diagonal branches.  LCX is a dominant artery that gives rise to one large OM1 branch and small PDA. There is no plaque.  Other findings:  Normal pulmonary vein drainage into the left atrium.  Normal left atrial appendage without a thrombus.  Normal size of the pulmonary artery.  IMPRESSION: 1. Coronary calcium score of 0. This was 0 percentile for age and sex matched control.  2. Normal coronary origin with LEFT dominance (small PDA).  3. No evidence of flow limiting CAD.  4. Aortic atherosclerosis.  Candee Furbish, MD [facility]  Zio Monitor: The patient wore the monitor for 7 days starting July 07, 2019. Indication: Palpitations The minimum heart rate was 66 bpm, maximum heart rate was 214 bpm, and average heart rate was 89 bpm. Predominant underlying rhythm was Sinus Rhythm. Idioventricular Rhythm was present.   2 Ventricular Tachycardia runs occurred, the run with the fastest interval lasting 4 beats with a maximum rate of 214 bpm, the longest lasting 4 beats with an average rate of 107 bpm.   4 Supraventricular Tachycardia runs occurred, the run with the fastest interval lasting 5 beats with a maximum rate of 174 bpm,  the longest lasting 7 beats with an average rate of 108 bpm.  Premature atrial complexes were rare (<1.0%). Premature Ventricular complexes were rare (<1.0%).  No pauses, No AV block and no atrial fibrillation present. 1 patient triggered event and 4 diary events are associated with sinus rhythm.  Conclusion: This study is remarkable for the following: 1. 2 runs of nonsustained ventricular tachycardia. 2. Paroxysmal supraventricular tachycardia, which is likely atrial tachycardia with variable block.  Echocardiogram March 2021 IMPRESSIONS  1. Left ventricular ejection fraction, by estimation, is 60 to 65%. The  left ventricle has normal function. The left ventricle has no regional  wall motion abnormalities. Left ventricular diastolic parameters are  consistent with Grade I diastolic  dysfunction (impaired relaxation).   FINDINGS  Left Ventricle: Left ventricular ejection fraction, by estimation, is 60 to 65%. The left ventricle has normal function. The left ventricle has no regional wall motion abnormalities. The left ventricular internal cavity  size was normal in size. There is no left ventricular hypertrophy. Left ventricular diastolic parameters are consistent with Grade I diastolic dysfunction (impaired relaxation).   Right Ventricle: The right ventricular size is normal. No increase in right ventricular wall thickness. Right ventricular systolic function is normal. There is normal pulmonary artery systolic pressure. The tricuspid regurgitant velocity is 2.13 m/s, and with an assumed right atrial pressure of 10 mmHg, the estimated right  ventricular systolic pressure is 28.1 mmHg.   Recent Labs: 11/03/2019: BUN 15; Creatinine, Ser 1.05; Magnesium 2.2; Potassium 4.7; Sodium 139  Recent Lipid Panel    Component Value Date/Time   CHOL 109 08/01/2019 1531   TRIG 135 08/01/2019 1531   HDL 38 (L) 08/01/2019 1531   CHOLHDL 2.9  08/01/2019 1531   CHOLHDL 3.8 04/01/2010 0910   VLDL 30 04/01/2010 0910   LDLCALC 47 08/01/2019 1531    Physical Exam:    VS:  BP 104/64   Pulse 86   Ht 5' 7" (1.702 m)   Wt 260 lb 12.8 oz (118.3 kg)   SpO2 98%   BMI 40.85 kg/m     Wt Readings from Last 3 Encounters:  10/23/20 260 lb 12.8 oz (118.3 kg)  07/05/20 263 lb (119.3 kg)  03/29/20 257 lb 8 oz (116.8 kg)     GEN: Well nourished, well developed in no acute distress HEENT: Normal NECK: No JVD; No carotid bruits LYMPHATICS: No lymphadenopathy CARDIAC: S1S2 noted,RRR, no murmurs, rubs, gallops RESPIRATORY:  Clear to auscultation without rales, wheezing or rhonchi  ABDOMEN: Soft, non-tender, non-distended, +bowel sounds, no guarding. EXTREMITIES: No edema, No cyanosis, no clubbing MUSCULOSKELETAL:  No deformity  SKIN: Warm and dry NEUROLOGIC:  Alert and oriented x 3, non-focal PSYCHIATRIC:  Normal affect, good insight  ASSESSMENT:    1. Essential hypertension   2. PAT (paroxysmal atrial tachycardia) (HCC)   3. NSVT (nonsustained ventricular tachycardia) (HCC)   4. TIA (transient ischemic attack)   5. Hypertension, unspecified type   6. Morbid obesity (HCC)   7. Type 2 diabetes mellitus without complication, without long-term current use of insulin (HCC)    PLAN:    At this time with her shortness of breath I like to proceed with a right heart catheterization in this patient.  She has had a coronary CTA that does not show any coronary artery disease, her echo does not show any right sided elevated pressure.  She has diastolic dysfunction.  I think a right heart catheterization is in order where I can be able to get another assessment of her right-sided heart pressure as well as her LVEDP.  We will continue to hold her diuretics for now and other medication.  The patient understands that risks include but are not limited to stroke (1 in 1000), death (1 in 1000), kidney failure [usually temporary] (1 in 500), bleeding  (1 in 200), allergic reaction [possibly serious] (1 in 200), and agrees to proceed.  I explained to the patient that weight loss may also help with her condition as deconditioning may be playing a role here.  The patient understands the need to lose weight with diet and exercise. We have discussed specific strategies for this.  This is being managed by his primary care doctor.  No adjustments for antidiabetic medications were made today.      The patient is in agreement with the above plan. The patient left the office in stable condition.  The patient will follow up in   Medication Adjustments/Labs and Tests Ordered: Current medicines are reviewed at length with the patient today.  Concerns regarding medicines are outlined above.  No orders of the defined types were placed in this encounter.  No orders of the defined types were placed in this encounter.   There are no Patient Instructions on file for this visit.   Adopting a Healthy Lifestyle.  Know what a healthy weight is for you (roughly BMI <25) and aim to maintain this   Aim for 7+ servings of fruits and vegetables daily   65-80+ fluid ounces of water or unsweet tea for healthy kidneys   Limit to max 1 drink of alcohol per day; avoid smoking/tobacco   Limit animal fats in diet for cholesterol and heart health - choose grass fed whenever available   Avoid highly processed foods, and foods high in saturated/trans fats   Aim for low stress - take time to unwind and care for your mental health   Aim for 150 min of moderate intensity exercise weekly for heart health, and weights twice weekly for bone health   Aim for 7-9 hours of sleep daily   When it comes to diets, agreement about the perfect plan isnt easy to find, even among the experts. Experts at the [place] developed an idea known as the Healthy Eating Plate. Just imagine a plate divided into logical, healthy portions.   The emphasis is on  diet quality:   Load up on vegetables and fruits - one-half of your plate: Aim for color and variety, and remember that potatoes dont count.   Go for whole grains - one-quarter of your plate: Whole wheat, barley, wheat berries, quinoa, oats, brown rice, and foods made with them. If you want pasta, go with whole wheat pasta.   Protein power - one-quarter of your plate: Fish, chicken, beans, and nuts are all healthy, versatile protein sources. Limit red meat.   The diet, however, does go beyond the plate, offering a few other suggestions.   Use healthy plant oils, such as olive, canola, soy, corn, sunflower and peanut. Check the labels, and avoid partially hydrogenated oil, which have unhealthy trans fats.   If youre thirsty, drink water. Coffee and tea are good in moderation, but skip sugary drinks and limit milk and dairy products to one or two daily servings.   The type of carbohydrate in the diet is more important than the amount. Some sources of carbohydrates, such as vegetables, fruits, whole grains, and beans-are healthier than others.   Finally, stay active  Signed, Berniece Salines, DO  10/23/2020 1:57 PM    [facility]

## 2020-10-24 DIAGNOSIS — N1832 Chronic kidney disease, stage 3b: Secondary | ICD-10-CM | POA: Insufficient documentation

## 2020-10-31 ENCOUNTER — Other Ambulatory Visit (HOSPITAL_COMMUNITY)
Admission: RE | Admit: 2020-10-31 | Discharge: 2020-10-31 | Disposition: A | Discharge status: Home / Self Care | Payer: Medicare HMO | Source: Ambulatory Visit | Attending: Interventional Cardiology | Admitting: Interventional Cardiology

## 2020-10-31 DIAGNOSIS — Z01812 Encounter for preprocedural laboratory examination: Secondary | ICD-10-CM | POA: Diagnosis present

## 2020-10-31 DIAGNOSIS — Z20822 Contact with and (suspected) exposure to covid-19: Secondary | ICD-10-CM | POA: Insufficient documentation

## 2020-10-31 LAB — SARS CORONAVIRUS 2 (TAT 6-24 HRS): SARS Coronavirus 2: NEGATIVE

## 2020-11-01 ENCOUNTER — Telehealth: Payer: Self-pay | Admitting: *Deleted

## 2020-11-01 NOTE — Telephone Encounter (Signed)
Pt contacted pre-right heart catheterization scheduled at [facility] for: Friday Nov 02, 2020 7:30 AM Verified arrival time and place: [facility]) at: 5:30 AM   No solid food after midnight prior to cath, clear liquids until 5 AM day of procedure.  Hold: Glipizide-AM of procedure Lasix-AM of procedure  AM meds can be  taken pre-cath with sips of water.    Confirmed patient has responsible adult to drive home post procedure and be with patient first 24 hours after arriving home: yes  You are allowed ONE visitor in the waiting room during the time you are at the hospital for your procedure. Both you and your visitor must wear a mask once you enter the hospital.  Reviewed procedure/mask/visitor instructions with patient.

## 2020-11-02 ENCOUNTER — Other Ambulatory Visit: Payer: Self-pay

## 2020-11-02 ENCOUNTER — Ambulatory Visit (HOSPITAL_COMMUNITY)
Admission: RE | Admit: 2020-11-02 | Discharge: 2020-11-02 | Disposition: A | Discharge status: Home / Self Care | Payer: Medicare HMO | Attending: Interventional Cardiology | Admitting: Interventional Cardiology

## 2020-11-02 ENCOUNTER — Encounter (HOSPITAL_COMMUNITY): Payer: Self-pay | Admitting: Interventional Cardiology

## 2020-11-02 ENCOUNTER — Encounter (HOSPITAL_COMMUNITY)
Admission: RE | Disposition: A | Discharge status: Home / Self Care | Payer: Self-pay | Source: Home / Self Care | Attending: Interventional Cardiology

## 2020-11-02 DIAGNOSIS — G459 Transient cerebral ischemic attack, unspecified: Secondary | ICD-10-CM | POA: Diagnosis not present

## 2020-11-02 DIAGNOSIS — Z885 Allergy status to narcotic agent status: Secondary | ICD-10-CM | POA: Insufficient documentation

## 2020-11-02 DIAGNOSIS — Z7982 Long term (current) use of aspirin: Secondary | ICD-10-CM | POA: Insufficient documentation

## 2020-11-02 DIAGNOSIS — Z888 Allergy status to other drugs, medicaments and biological substances status: Secondary | ICD-10-CM | POA: Diagnosis not present

## 2020-11-02 DIAGNOSIS — R0600 Dyspnea, unspecified: Secondary | ICD-10-CM

## 2020-11-02 DIAGNOSIS — I471 Supraventricular tachycardia: Secondary | ICD-10-CM | POA: Insufficient documentation

## 2020-11-02 DIAGNOSIS — Z794 Long term (current) use of insulin: Secondary | ICD-10-CM | POA: Diagnosis not present

## 2020-11-02 DIAGNOSIS — I10 Essential (primary) hypertension: Secondary | ICD-10-CM | POA: Insufficient documentation

## 2020-11-02 DIAGNOSIS — Z79899 Other long term (current) drug therapy: Secondary | ICD-10-CM | POA: Insufficient documentation

## 2020-11-02 DIAGNOSIS — Z881 Allergy status to other antibiotic agents status: Secondary | ICD-10-CM | POA: Diagnosis not present

## 2020-11-02 DIAGNOSIS — Z7984 Long term (current) use of oral hypoglycemic drugs: Secondary | ICD-10-CM | POA: Insufficient documentation

## 2020-11-02 DIAGNOSIS — R0602 Shortness of breath: Secondary | ICD-10-CM | POA: Diagnosis present

## 2020-11-02 DIAGNOSIS — Z6841 Body Mass Index (BMI) 40.0 and over, adult: Secondary | ICD-10-CM | POA: Diagnosis not present

## 2020-11-02 DIAGNOSIS — E119 Type 2 diabetes mellitus without complications: Secondary | ICD-10-CM

## 2020-11-02 DIAGNOSIS — I4719 Other supraventricular tachycardia: Secondary | ICD-10-CM | POA: Diagnosis present

## 2020-11-02 HISTORY — PX: RIGHT HEART CATH: CATH118263

## 2020-11-02 LAB — POCT I-STAT EG7
Acid-Base Excess: 2 mmol/L (ref 0.0–2.0)
Acid-Base Excess: 3 mmol/L — ABNORMAL HIGH (ref 0.0–2.0)
Bicarbonate: 28.8 mmol/L — ABNORMAL HIGH (ref 20.0–28.0)
Bicarbonate: 29.8 mmol/L — ABNORMAL HIGH (ref 20.0–28.0)
Calcium, Ion: 1.22 mmol/L (ref 1.15–1.40)
Calcium, Ion: 1.22 mmol/L (ref 1.15–1.40)
HCT: 35 % — ABNORMAL LOW (ref 36.0–46.0)
HCT: 35 % — ABNORMAL LOW (ref 36.0–46.0)
Hemoglobin: 11.9 g/dL — ABNORMAL LOW (ref 12.0–15.0)
Hemoglobin: 11.9 g/dL — ABNORMAL LOW (ref 12.0–15.0)
O2 Saturation: 72 %
O2 Saturation: 73 %
Potassium: 4.1 mmol/L (ref 3.5–5.1)
Potassium: 4.1 mmol/L (ref 3.5–5.1)
Sodium: 139 mmol/L (ref 135–145)
Sodium: 139 mmol/L (ref 135–145)
TCO2: 30 mmol/L (ref 22–32)
TCO2: 31 mmol/L (ref 22–32)
pCO2, Ven: 51.9 mmHg (ref 44.0–60.0)
pCO2, Ven: 54.2 mmHg (ref 44.0–60.0)
pH, Ven: 7.349 (ref 7.250–7.430)
pH, Ven: 7.353 (ref 7.250–7.430)
pO2, Ven: 41 mmHg (ref 32.0–45.0)
pO2, Ven: 41 mmHg (ref 32.0–45.0)

## 2020-11-02 LAB — GLUCOSE, CAPILLARY: Glucose-Capillary: 125 mg/dL — ABNORMAL HIGH (ref 70–99)

## 2020-11-02 SURGERY — RIGHT HEART CATH

## 2020-11-02 MED ORDER — LIDOCAINE HCL (PF) 1 % IJ SOLN
INTRAMUSCULAR | Status: AC
  Filled 2020-11-02: qty 30

## 2020-11-02 MED ORDER — FENTANYL CITRATE (PF) 100 MCG/2ML IJ SOLN
INTRAMUSCULAR | Status: DC | PRN
  Administered 2020-11-02 (×2): 25 ug via INTRAVENOUS

## 2020-11-02 MED ORDER — ONDANSETRON HCL 4 MG/2ML IJ SOLN: 4.0000 mg | Freq: Four times a day (QID) | INTRAMUSCULAR | Status: DC | PRN

## 2020-11-02 MED ORDER — MIDAZOLAM HCL 2 MG/2ML IJ SOLN
INTRAMUSCULAR | Status: AC
  Filled 2020-11-02: qty 2

## 2020-11-02 MED ORDER — SODIUM CHLORIDE 0.9 % IV SOLN: 250.0000 mL | INTRAVENOUS | Status: DC | PRN

## 2020-11-02 MED ORDER — SODIUM CHLORIDE 0.9 % WEIGHT BASED INFUSION: 1.0000 mL/kg/h | INTRAVENOUS | Status: DC

## 2020-11-02 MED ORDER — SODIUM CHLORIDE 0.9% FLUSH: 3.0000 mL | Freq: Two times a day (BID) | INTRAVENOUS | Status: DC

## 2020-11-02 MED ORDER — HYDRALAZINE HCL 20 MG/ML IJ SOLN: 10.0000 mg | INTRAMUSCULAR | Status: DC | PRN

## 2020-11-02 MED ORDER — MIDAZOLAM HCL 2 MG/2ML IJ SOLN
INTRAMUSCULAR | Status: DC | PRN
  Administered 2020-11-02: 0.5 mg via INTRAVENOUS
  Administered 2020-11-02: 1 mg via INTRAVENOUS

## 2020-11-02 MED ORDER — LIDOCAINE HCL (PF) 1 % IJ SOLN
INTRAMUSCULAR | Status: DC | PRN
  Administered 2020-11-02: 2 mL

## 2020-11-02 MED ORDER — SODIUM CHLORIDE 0.9% FLUSH: 3.0000 mL | INTRAVENOUS | Status: DC | PRN

## 2020-11-02 MED ORDER — SODIUM CHLORIDE 0.9 % WEIGHT BASED INFUSION: 3.0000 mL/kg/h | INTRAVENOUS | Status: AC

## 2020-11-02 MED ORDER — SODIUM CHLORIDE 0.9 % IV SOLN: INTRAVENOUS | Status: DC

## 2020-11-02 MED ORDER — HEPARIN (PORCINE) IN NACL 1000-0.9 UT/500ML-% IV SOLN
INTRAVENOUS | Status: AC
  Filled 2020-11-02: qty 500

## 2020-11-02 MED ORDER — LABETALOL HCL 5 MG/ML IV SOLN: 10.0000 mg | INTRAVENOUS | Status: DC | PRN

## 2020-11-02 MED ORDER — FENTANYL CITRATE (PF) 100 MCG/2ML IJ SOLN
INTRAMUSCULAR | Status: AC
  Filled 2020-11-02: qty 2

## 2020-11-02 MED ORDER — HEPARIN (PORCINE) IN NACL 1000-0.9 UT/500ML-% IV SOLN
INTRAVENOUS | Status: DC | PRN
  Administered 2020-11-02: 500 mL

## 2020-11-02 SURGICAL SUPPLY — 8 items
CATH BALLN WEDGE 5F 110CM (CATHETERS) ×2 IMPLANT
GLIDESHEATH SLEND SS 6F .021 (SHEATH) ×2 IMPLANT
GUIDEWIRE INQWIRE 1.5J.035X260 (WIRE) ×1 IMPLANT
INQWIRE 1.5J .035X260CM (WIRE) ×2
SHEATH GLIDE SLENDER 4/5FR (SHEATH) ×2 IMPLANT
TRANSDUCER W/STOPCOCK (MISCELLANEOUS) ×2 IMPLANT
TUBING ART PRESS 72  MALE/FEM (TUBING) ×2
TUBING ART PRESS 72 MALE/FEM (TUBING) ×1 IMPLANT

## 2020-11-02 NOTE — Interval H&P Note (Signed)
Cath Lab Visit (complete for each Cath Lab visit)  Clinical Evaluation Leading to the Procedure:   ACS: No.  Non-ACS:    Anginal Classification: No Symptoms  Anti-ischemic medical therapy: No Therapy  Non-Invasive Test Results: No non-invasive testing performed  Prior CABG: No previous CABG      History and Physical Interval Note:  11/02/2020 7:39 AM  Tonya Sanchez  has presented today for surgery, with the diagnosis of shortness of breath.  The various methods of treatment have been discussed with the patient and family. After consideration of risks, benefits and other options for treatment, the patient has consented to  Procedure(s): RIGHT HEART CATH (N/A) as a surgical intervention.  The patient's history has been reviewed, patient examined, no change in status, stable for surgery.  I have reviewed the patient's chart and labs.  Questions were answered to the patient's satisfaction.     Belva Crome III

## 2020-11-02 NOTE — Research (Signed)
IDENTIFY Informed Consent   Subject Name: Tonya Sanchez  Subject met inclusion and exclusion criteria.  The informed consent form, study requirements and expectations were reviewed with the subject and questions and concerns were addressed prior to the signing of the consent form.  The subject verbalized understanding of the trail requirements.  The subject agreed to participate in the IDENTIFY trial and signed the informed consent.  The informed consent was obtained prior to performance of any protocol-specific procedures for the subject.  A copy of the signed informed consent was given to the subject and a copy was placed in the subject's medical record.  Mena Goes. 11/02/2020, 06:55 am

## 2020-11-02 NOTE — CV Procedure (Signed)
   Normal right heart pressures.  Normal cardiac output.  Central mixed venous O2 saturation 73%.  No evidence of shunting.

## 2020-11-02 NOTE — Discharge Instructions (Signed)
Brachial Site Care  This sheet gives you information about how to care for yourself after your procedure. Your health care provider may also give you more specific instructions. If you have problems or questions, contact your health care provider. What can I expect after the procedure? After the procedure, it is common to have:  Bruising and tenderness at the catheter insertion area. Follow these instructions at home: Medicines  Take over-the-counter and prescription medicines only as told by your health care provider. Insertion site care  Follow instructions from your health care provider about how to take care of your insertion site. Make sure you: ? Wash your hands with soap and water before you change your bandage (dressing). If soap and water are not available, use hand sanitizer. ? Change your dressing as told by your health care provider. ? Leave stitches (sutures), skin glue, or adhesive strips in place. These skin closures may need to stay in place for 2 weeks or longer. If adhesive strip edges start to loosen and curl up, you may trim the loose edges. Do not remove adhesive strips completely unless your health care provider tells you to do that.  Check your insertion site every day for signs of infection. Check for: ? Redness, swelling, or pain. ? Fluid or blood. ? Pus or a bad smell. ? Warmth.  Do not take baths, swim, or use a hot tub until your health care provider approves.  You may shower 24-48 hours after the procedure, or as directed by your health care provider. ? Remove the dressing and gently wash the site with plain soap and water. ? Pat the area dry with a clean towel. ? Do not rub the site. That could cause bleeding.  Do not apply powder or lotion to the site. Activity  For 24 hours after the procedure, or as directed by your health care provider: ? Do not flex or bend the affected arm. ? Do not push or pull heavy objects with the affected arm. ? Do not  drive yourself home from the hospital or clinic. You may drive 24 hours after the procedure unless your health care provider tells you not to. ? Do not operate machinery or power tools.  Do not lift anything that is heavier than 10 lb (4.5 kg), or the limit that you are told, until your health care provider says that it is safe.  Ask your health care provider when it is okay to: ? Return to work or school. ? Resume usual physical activities or sports. ? Resume sexual activity.   General instructions  If the catheter site starts to bleed, raise your arm and put firm pressure on the site. If the bleeding does not stop, get help right away. This is a medical emergency.  If you went home on the same day as your procedure, a responsible adult should be with you for the first 24 hours after you arrive home.  Keep all follow-up visits as told by your health care provider. This is important. Contact a health care provider if:  You have a fever.  You have redness, swelling, or yellow drainage around your insertion site. Get help right away if:  You have unusual pain at the radial site.  The catheter insertion area swells very fast.  The insertion area is bleeding, and the bleeding does not stop when you hold steady pressure on the area.  Your arm or hand becomes pale, cool, tingly, or numb. These symptoms may represent a serious  problem that is an emergency. Do not wait to see if the symptoms will go away. Get medical help right away. Call your local emergency services (911 in the [place] Do not drive yourself to the hospital. Summary  After the procedure, it is common to have bruising and tenderness at the site.  Follow instructions from your health care provider about how to take care of your radial site wound. Check the wound every day for signs of infection.  Do not lift anything that is heavier than 10 lb (4.5 kg), or the limit that you are told, until your health care provider says  that it is safe. This information is not intended to replace advice given to you by your health care provider. Make sure you discuss any questions you have with your health care provider. Document Revised: 07/15/2017 Document Reviewed: 07/15/2017 Elsevier Patient Education  2021 [employer].

## 2020-11-13 ENCOUNTER — Other Ambulatory Visit: Payer: Self-pay | Admitting: Cardiology

## 2020-11-20 ENCOUNTER — Other Ambulatory Visit: Payer: Self-pay

## 2020-11-22 ENCOUNTER — Ambulatory Visit (INDEPENDENT_AMBULATORY_CARE_PROVIDER_SITE_OTHER): Payer: Medicare HMO | Admitting: Cardiology

## 2020-11-22 ENCOUNTER — Encounter: Payer: Self-pay | Admitting: Cardiology

## 2020-11-22 ENCOUNTER — Other Ambulatory Visit: Payer: Self-pay

## 2020-11-22 DIAGNOSIS — I472 Ventricular tachycardia: Secondary | ICD-10-CM

## 2020-11-22 DIAGNOSIS — I471 Supraventricular tachycardia: Secondary | ICD-10-CM

## 2020-11-22 DIAGNOSIS — I4729 Other ventricular tachycardia: Secondary | ICD-10-CM

## 2020-11-22 DIAGNOSIS — I10 Essential (primary) hypertension: Secondary | ICD-10-CM

## 2020-11-22 NOTE — Patient Instructions (Signed)
Medication Instructions:  Your physician recommends that you continue on your current medications as directed. Please refer to the Current Medication list given to you today.  *If you need a refill on your cardiac medications before your next appointment, please call your pharmacy*   Lab Work: None If you have labs (blood work) drawn today and your tests are completely normal, you will receive your results only by: Marland Kitchen MyChart Message (if you have MyChart) OR . A paper copy in the mail If you have any lab test that is abnormal or we need to change your treatment, we will call you to review the results.   Testing/Procedures: None   Follow-Up: At [facility], you and your health needs are our priority.  As part of our continuing mission to provide you with exceptional heart care, we have created designated Provider Care Teams.  These Care Teams include your primary Cardiologist (physician) and Advanced Practice Providers (APPs -  Physician Assistants and Nurse Practitioners) who all work together to provide you with the care you need, when you need it.  We recommend signing up for the patient portal called "MyChart".  Sign up information is provided on this After Visit Summary.  MyChart is used to connect with patients for Virtual Visits (Telemedicine).  Patients are able to view lab/test results, encounter notes, upcoming appointments, etc.  Non-urgent messages can be sent to your provider as well.   To learn more about what you can do with MyChart, go to [email or web address].    Your next appointment:   1 year(s)  The format for your next appointment:   In Person  Provider:   [address]- Berniece Salines, DO   Other Instructions

## 2020-11-22 NOTE — Progress Notes (Signed)
Cardiology Office Note:    Date:  11/22/2020   ID:  Tonya Sanchez, DOB August 18, 1950, MRN [id number]  PCP:  Verdell Carmine., MD  Cardiologist:  Berniece Salines, DO  Electrophysiologist:  None   Referring MD: Verdell Carmine., MD   " I am still short of breath"  History of Present Illness:    Tonya Sanchez is a 70 y.o. female with a hx of hypertension, diabetes mellitus, family history of premature coronary artery disease in her brother in his early 32s hyperlipidemia is here today for follow-up visit.  Did see the patient back in May 2021 at that time we discussed her CT results which showed no evidence of coronary artery disease, her echocardiogram as well as a 0 monitor showing paroxysmal atrial tachycardia and NSVT.  In January 2022 she presented for follow-up visit.  She still short of breath.  We talked about deconditioning as well as the possibility of shortness of breath due to diastolic dysfunction.  She was going to increase her exercise/activity.  She also did see pulmonary with no report of any pulmonary pathology that could be causing her shortness of breath.  At her last visit she was still short of breath we discussed her coronary CTA I recommended the patient undergo a right heart catheterization to understand her LVEDP if there is any elevated right-sided pressure.  She has had her right heart catheterization which was normal.  Past Medical History:  Diagnosis Date  . Achilles tendonitis, bilateral 02/04/2019  . Acute medial meniscus tear of right knee 09/23/2019   Formatting of this note might be different from the original. Added automatically from request for surgery [phone]  . Anxiety    occasional - no current med.  . Arthritis    "everywhere"  . Bladder tumor 05/03/2018  . Cervical spondylosis 04/02/2018  . Cervical spondylosis with radiculopathy 12/23/2017  . Cervical stenosis of spinal canal 12/23/2017   Added automatically from request for surgery [phone]  . Chronic  pain syndrome 05/03/2018  . De Quervain's disease (tenosynovitis) 10/13/2017  . Diabetic peripheral neuropathy ([place]) 03/18/2019  . Essential hypertension 12/28/2017  . Fibromyalgia 05/03/2018  . Gastroesophageal reflux disease 05/06/2018  . Hand arthritis 01/08/2017  . History of DVT (deep vein thrombosis) 1960s   no problems since  . History of recurrent UTIs 05/03/2018  . 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 Hypertension    under control with meds., has been on med. x 3-4 yr.  . Leg swelling 02/04/2019  . Lumbar facet arthropathy 08/19/2017  . Lumbar radiculopathy 03/18/2019  . Mass of hand 10/2012   left  . Metatarsal stress fracture, left, initial encounter 11/01/2014  . Moderate episode of recurrent major depressive disorder ([place]) 05/03/2018  . Morbid obesity ([place]) 02/04/2019  . NSVT (nonsustained ventricular tachycardia) ([place]) 08/05/2019  . Obesity (BMI 30.0-34.9) 05/03/2018  . Other hyperlipidemia 05/03/2018  . Pain, joint, ankle and foot, unspecified laterality 11/01/2014  . PAT (paroxysmal atrial tachycardia) ([place]) 08/05/2019  . Primary localized osteoarthrosis, ankle and foot 02/04/2019  . Primary osteoarthritis of first carpometacarpal joint of left hand 07/08/2019   Formatting of this note might be different from the original. Added automatically from request for surgery [phone]  . S/P cervical spinal fusion 04/02/2018  . Stenosing tenosynovitis of wrist 10/2012   left  . TIA (transient ischemic attack) 03/2010   no deficits  . Tremor, essential 10/21/2017  . Type 2 diabetes mellitus ([place]) 12/28/2017  . Wrist tendonitis 10/13/2017  Past Surgical History:  Procedure Laterality Date  . ABDOMINAL HYSTERECTOMY     partial  . ANTERIOR AND POSTERIOR VAGINAL REPAIR  04/07/2000   with vaginal vault suspension  . ANTERIOR CERVICAL DECOMP/DISCECTOMY FUSION  10/20/2000   C5-6  . CARPAL TUNNEL RELEASE Right 04/12/2003  . CARPAL TUNNEL RELEASE Left 05/25/2003  . CARPAL TUNNEL RELEASE Left 10/27/2012    Procedure: LEFT WRIST STENOSISNG TENOSYNOVITIS RELEASE;  Surgeon: Schuyler Amor, MD;  Location: [facility];  Service: Orthopedics;  Laterality: Left;  . CHOLECYSTECTOMY    . CLOSED MANIPULATION KNEE WITH STERIOD INJECTION Left 01/27/2000  . CYSTOSCOPY WITH BIOPSY  04/01/2011   bladder bx. x 4  . EXAMINATION UNDER ANESTHESIA Left 01/27/2000   ankle - with steroid injection  . FOOT ARTHRODESIS, TRIPLE Bilateral   . FOOT GANGLION EXCISION Left 02/10/2002  . FOOT SURGERY Bilateral    spurs removed from both little toes  . KNEE ARTHROSCOPY Right   . PUBOVAGINAL SLING  04/07/2000  . RIGHT HEART CATH N/A 11/02/2020   Procedure: RIGHT HEART CATH;  Surgeon: Belva Crome, MD;  Location: [facility];  Service: Cardiovascular;  Laterality: N/A;  . TENDON RECONSTRUCTION Left    foot  . TOTAL KNEE ARTHROPLASTY Left 09/09/1999    Current Medications: Current Meds  Medication Sig  . amitriptyline (ELAVIL) 50 MG tablet Take 50 mg by mouth at bedtime.  Marland Kitchen aspirin 325 MG tablet Take 325 mg by mouth daily.  Marland Kitchen atorvastatin (LIPITOR) 10 MG tablet Take 10 mg by mouth daily.  . Cholecalciferol 125 MCG (5000 UT) TABS Take 5,000 Units by mouth daily.  Marland Kitchen dicyclomine (BENTYL) 10 MG capsule Take 10 mg by mouth daily.  . Dulaglutide 4.5 MG/0.5ML SOPN Inject 4.5 mg into the muscle every Thursday.  . esomeprazole (NEXIUM) 40 MG capsule Take 40 mg by mouth daily before breakfast.  . furosemide (LASIX) 40 MG tablet TAKE 1 TABLET BY MOUTH EVERY DAY  . glipiZIDE (GLUCOTROL XL) 5 MG 24 hr tablet Take 1 tablet (5 mg total) by mouth daily.  . meloxicam (MOBIC) 15 MG tablet Take 15 mg by mouth daily.  Marland Kitchen olmesartan (BENICAR) 40 MG tablet Take 40 mg by mouth daily.  . pregabalin (LYRICA) 25 MG capsule Take 25 mg by mouth at bedtime.  . Venlafaxine HCl 225 MG TB24 Take 225 mg by mouth daily.     Allergies:   Gabapentin, Keflex [cephalexin], Metformin hcl, and Percocet [oxycodone-acetaminophen]    Social History   Socioeconomic History  . Marital status: Married    Spouse name: Not on file  . Number of children: Not on file  . Years of education: Not on file  . Highest education level: Not on file  Occupational History  . Not on file  Tobacco Use  . Smoking status: Never Smoker  . Smokeless tobacco: Never Used  Substance and Sexual Activity  . Alcohol use: No  . Drug use: No  . Sexual activity: Not on file  Other Topics Concern  . Not on file  Social History Narrative  . Not on file   Social Determinants of Health   Financial Resource Strain: Not on file  Food Insecurity: Not on file  Transportation Needs: Not on file  Physical Activity: Not on file  Stress: Not on file  Social Connections: Not on file     Family History: The patient's family history includes Breast cancer in her mother; COPD in her father; Diabetes in her  mother; Emphysema in her brother; Heart disease in her brother and father; Sjogren's syndrome in her sister; Stroke in her brother, father, and sister.  ROS:   Review of Systems  Constitution: Negative for decreased appetite, fever and weight gain.  HENT: Negative for congestion, ear discharge, hoarse voice and sore throat.   Eyes: Negative for discharge, redness, vision loss in right eye and visual halos.  Cardiovascular: Negative for chest pain, dyspnea on exertion, leg swelling, orthopnea and palpitations.  Respiratory: Negative for cough, hemoptysis, shortness of breath and snoring.   Endocrine: Negative for heat intolerance and polyphagia.  Hematologic/Lymphatic: Negative for bleeding problem. Does not bruise/bleed easily.  Skin: Negative for flushing, nail changes, rash and suspicious lesions.  Musculoskeletal: Negative for arthritis, joint pain, muscle cramps, myalgias, neck pain and stiffness.  Gastrointestinal: Negative for abdominal pain, bowel incontinence, diarrhea and excessive appetite.  Genitourinary: Negative for decreased  libido, genital sores and incomplete emptying.  Neurological: Negative for brief paralysis, focal weakness, headaches and loss of balance.  Psychiatric/Behavioral: Negative for altered mental status, depression and suicidal ideas.  Allergic/Immunologic: Negative for HIV exposure and persistent infections.    EKGs/Labs/Other Studies Reviewed:    The following studies were reviewed today:   EKG:  The ekg ordered today demonstrates   [place] 11/02/2020  Normal right heart pressures.  Capillary wedge pressure 14 mmHg.  Pulmonary artery oxygen saturation 73%.  Aortic oxygen saturation 100%.  Cardiac output 6.6 L/min with an index of 2.92.  Pulmonary vascular resistance 1.37 [place] units.   Coronary CTA February 2021 Aorta: Normal size. Mild aortic atherosclerosis. No dissection.  Aortic Valve: Trileaflet. Mild basal leaflet calcifications. Annular mild calcification adjacent to LM ostium.  Coronary Arteries: Normal coronary origin. Left dominance.  RCA is a small non-dominant artery. There is no plaque.  Left main is a large artery that gives rise to LAD and LCX arteries.  LAD is a large vessel that has no plaque. There are mild luminal irregularities. 3 fairly small diagonal branches.  LCX is a dominant artery that gives rise to one large OM1 branch and small PDA. There is no plaque.  Other findings:  Normal pulmonary vein drainage into the left atrium.  Normal left atrial appendage without a thrombus.  Normal size of the pulmonary artery.  IMPRESSION: 1. Coronary calcium score of 0. This was 0 percentile for age and sex matched control.  2. Normal coronary origin with LEFT dominance (small PDA).  3. No evidence of flow limiting CAD.  4. Aortic atherosclerosis.  Candee Furbish, MD [facility]  Zio Monitor: The patient wore the monitor for 7 days starting July 07, 2019. Indication: Palpitations The minimum heart rate was 66 bpm, maximum heart rate  was 214 bpm, and average heart rate was 89 bpm. Predominant underlying rhythm was Sinus Rhythm. Idioventricular Rhythm was present.   2 Ventricular Tachycardia runs occurred, the run with the fastest interval lasting 4 beats with a maximum rate of 214 bpm, the longest lasting 4 beats with an average rate of 107 bpm.   4 Supraventricular Tachycardia runs occurred, the run with the fastest interval lasting 5 beats with a maximum rate of 174 bpm, the longest lasting 7 beats with an average rate of 108 bpm.  Premature atrial complexes were rare (<1.0%). Premature Ventricular complexes were rare (<1.0%).  No pauses, No AV block and no atrial fibrillation present. 1 patient triggered event and 4 diary events are associated with sinus rhythm.  Conclusion: This study is remarkable for the  following: 1. 2 runs of nonsustained ventricular tachycardia. 2. Paroxysmal supraventricular tachycardia, which is likely atrial tachycardia with variable block.  Echocardiogram March 2021 IMPRESSIONS  1. Left ventricular ejection fraction, by estimation, is 60 to 65%. The  left ventricle has normal function. The left ventricle has no regional  wall motion abnormalities. Left ventricular diastolic parameters are  consistent with Grade I diastolic  dysfunction (impaired relaxation).   FINDINGS  Left Ventricle: Left ventricular ejection fraction, by estimation, is 60 to 65%. The left ventricle has normal function. The left ventricle has no regional wall motion abnormalities. The left ventricular internal cavity  size was normal in size. There is no left ventricular hypertrophy. Left ventricular diastolic parameters are consistent with Grade I diastolic dysfunction (impaired relaxation).   Right Ventricle: The right ventricular size is normal. No increase in right ventricular wall thickness. Right ventricular systolic function is normal. There is normal  pulmonary artery systolic pressure. The tricuspid regurgitant velocity is 2.13 m/s, and with an assumed right atrial pressure of 10 mmHg, the estimated right  ventricular systolic pressure is 29.4 mmHg.   Recent Labs: 11/02/2020: Hemoglobin 11.9; Hemoglobin 11.9; Potassium 4.1; Potassium 4.1; Sodium 139; Sodium 139  Recent Lipid Panel    Component Value Date/Time   CHOL 109 08/01/2019 1531   TRIG 135 08/01/2019 1531   HDL 38 (L) 08/01/2019 1531   CHOLHDL 2.9 08/01/2019 1531   CHOLHDL 3.8 04/01/2010 0910   VLDL 30 04/01/2010 0910   LDLCALC 47 08/01/2019 1531    Physical Exam:    VS:  BP 136/84   Pulse 90   Ht 5\' 7"  (1.702 m)   Wt 262 lb 9.6 oz (119.1 kg)   SpO2 97%   BMI 41.13 kg/m     Wt Readings from Last 3 Encounters:  11/22/20 262 lb 9.6 oz (119.1 kg)  11/02/20 260 lb (117.9 kg)  10/23/20 260 lb 12.8 oz (118.3 kg)     GEN: Well nourished, well developed in no acute distress HEENT: Normal NECK: No JVD; No carotid bruits LYMPHATICS: No lymphadenopathy CARDIAC: S1S2 noted,RRR, no murmurs, rubs, gallops RESPIRATORY:  Clear to auscultation without rales, wheezing or rhonchi  ABDOMEN: Soft, non-tender, non-distended, +bowel sounds, no guarding. EXTREMITIES: No edema, No cyanosis, no clubbing MUSCULOSKELETAL:  No deformity  SKIN: Warm and dry NEUROLOGIC:  Alert and oriented x 3, non-focal PSYCHIATRIC:  Normal affect, good insight  ASSESSMENT:    1. Morbid obesity ([place])   2. Essential hypertension   3. PAT (paroxysmal atrial tachycardia) ([place])   4. NSVT (nonsustained ventricular tachycardia) ([facility])    PLAN:    She still is short of breath I think that deconditioning may be playing a role here.  We have looked at her coronary arteries her right-sided heart pressures which are normal.  At this time we also discussed potential weight loss she is agreeable to follow-up with our medical weight management clinic which I am going to refer her today.  Her blood pressure is  acceptable continue the current medication regimen.  The patient understands the need to lose weight with diet and exercise. We have discussed specific strategies for this.  This is being managed by his primary care doctor.  No adjustments for antidiabetic medications were made today.  The patient is in agreement with the above plan. The patient left the office in stable condition.  The patient will follow up in 1 year   Medication Adjustments/Labs and Tests Ordered: Current medicines are reviewed at length with  the patient today.  Concerns regarding medicines are outlined above.  Orders Placed This Encounter  Procedures  . Amb Ref to Medical Weight Management   No orders of the defined types were placed in this encounter.   Patient Instructions  Medication Instructions:  Your physician recommends that you continue on your current medications as directed. Please refer to the Current Medication list given to you today.  *If you need a refill on your cardiac medications before your next appointment, please call your pharmacy*   Lab Work: None If you have labs (blood work) drawn today and your tests are completely normal, you will receive your results only by: Marland Kitchen MyChart Message (if you have MyChart) OR . A paper copy in the mail If you have any lab test that is abnormal or we need to change your treatment, we will call you to review the results.   Testing/Procedures: None   Follow-Up: At [facility], you and your health needs are our priority.  As part of our continuing mission to provide you with exceptional heart care, we have created designated Provider Care Teams.  These Care Teams include your primary Cardiologist (physician) and Advanced Practice Providers (APPs -  Physician Assistants and Nurse Practitioners) who all work together to provide you with the care you need, when you need it.  We recommend signing up for the patient portal called "MyChart".  Sign up  information is provided on this After Visit Summary.  MyChart is used to connect with patients for Virtual Visits (Telemedicine).  Patients are able to view lab/test results, encounter notes, upcoming appointments, etc.  Non-urgent messages can be sent to your provider as well.   To learn more about what you can do with MyChart, go to [email or web address].    Your next appointment:   1 year(s)  The format for your next appointment:   In Person  Provider:   [address]- Berniece Salines, DO   Other Instructions      Adopting a Healthy Lifestyle.  Know what a healthy weight is for you (roughly BMI <25) and aim to maintain this   Aim for 7+ servings of fruits and vegetables daily   65-80+ fluid ounces of water or unsweet tea for healthy kidneys   Limit to max 1 drink of alcohol per day; avoid smoking/tobacco   Limit animal fats in diet for cholesterol and heart health - choose grass fed whenever available   Avoid highly processed foods, and foods high in saturated/trans fats   Aim for low stress - take time to unwind and care for your mental health   Aim for 150 min of moderate intensity exercise weekly for heart health, and weights twice weekly for bone health   Aim for 7-9 hours of sleep daily   When it comes to diets, agreement about the perfect plan isnt easy to find, even among the experts. Experts at the [employer] developed an idea known as the Healthy Eating Plate. Just imagine a plate divided into logical, healthy portions.   The emphasis is on diet quality:   Load up on vegetables and fruits - one-half of your plate: Aim for color and variety, and remember that potatoes dont count.   Go for whole grains - one-quarter of your plate: Whole wheat, barley, wheat berries, quinoa, oats, brown rice, and foods made with them. If you want pasta, go with whole wheat pasta.   Protein power - one-quarter of your plate: Fish, chicken, beans,  and nuts  are all healthy, versatile protein sources. Limit red meat.   The diet, however, does go beyond the plate, offering a few other suggestions.   Use healthy plant oils, such as olive, canola, soy, corn, sunflower and peanut. Check the labels, and avoid partially hydrogenated oil, which have unhealthy trans fats.   If youre thirsty, drink water. Coffee and tea are good in moderation, but skip sugary drinks and limit milk and dairy products to one or two daily servings.   The type of carbohydrate in the diet is more important than the amount. Some sources of carbohydrates, such as vegetables, fruits, whole grains, and beans-are healthier than others.   Finally, stay active  Signed, Berniece Salines, DO  11/22/2020 1:44 PM    [facility]

## 2021-03-15 DIAGNOSIS — M1711 Unilateral primary osteoarthritis, right knee: Secondary | ICD-10-CM | POA: Insufficient documentation

## 2021-05-15 IMAGING — CT CT HEART MORP W/ CTA COR W/ SCORE W/ CA W/CM &/OR W/O CM
4 of 7 series · 8 of 20 positions shown, 9 images · IV contrast (APPLIED)
Comparison: None.
COMPARISON: None.

Addendum:
EXAM:
OVER-READ INTERPRETATION  CT CHEST

The following report is an over-read performed by radiologist Dr.
Fina Fain [REDACTED] on 08/10/2019. This over-read
does not include interpretation of cardiac or coronary anatomy or
pathology. The coronary CTA interpretation by the cardiologist is
attached.
CLINICAL DATA: 68 year old with angina.
Cardiac/Coronary  CTA
TECHNIQUE: The patient was scanned on a Phillips Force scanner.

[Series 6: best diast 75 % · axial · 0.38mm/px · z∈[+1029,+1065]mm · 2 of 271 slices shown, 3 images]
[im 91/271  vessel]
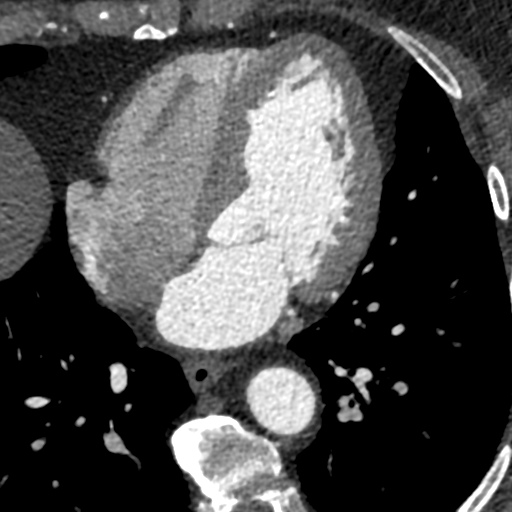
[im 91/271  lung]
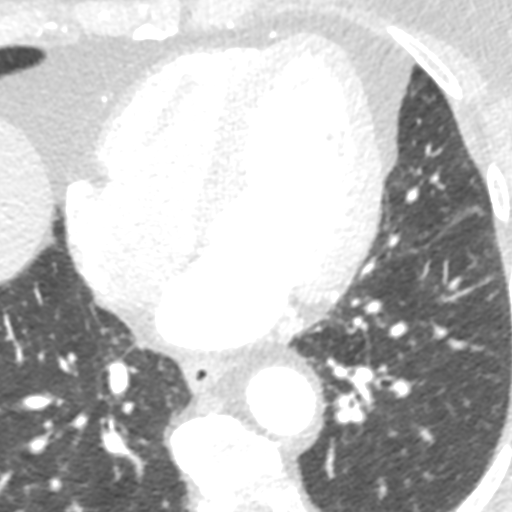
[im 181/271  vessel]
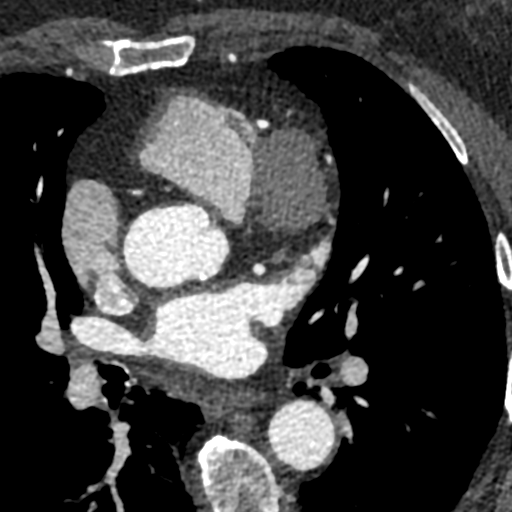

[Series 7: best syst 42 % · axial · 0.38mm/px · z∈[+1029,+1065]mm · 2 of 271 slices shown]
[im 91/271  vessel]
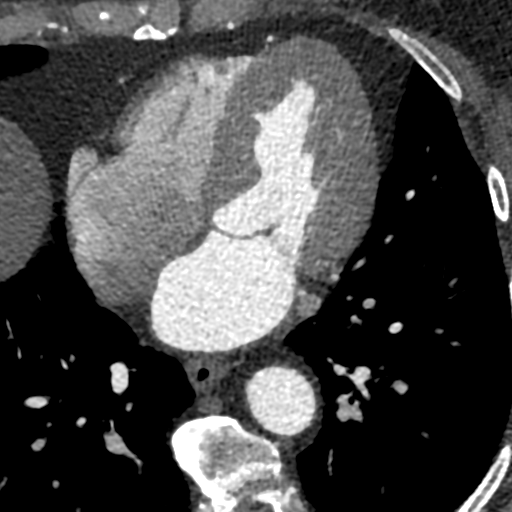
[im 181/271  vessel]
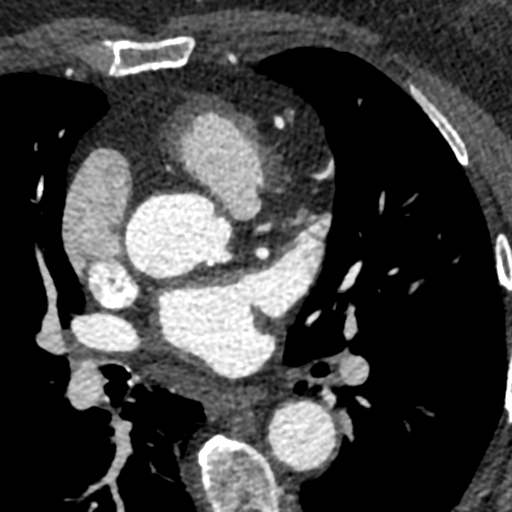

[Series 8: ts diast sharp 42 % · axial · 0.38mm/px · z∈[+1029,+1065]mm · 2 of 271 slices shown]
[im 91/271  lung]
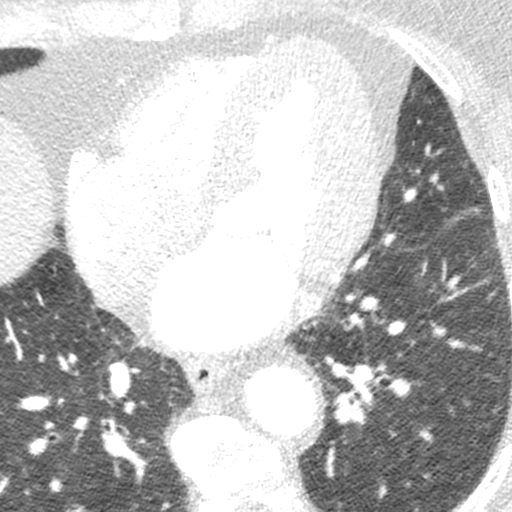
[im 181/271  lung]
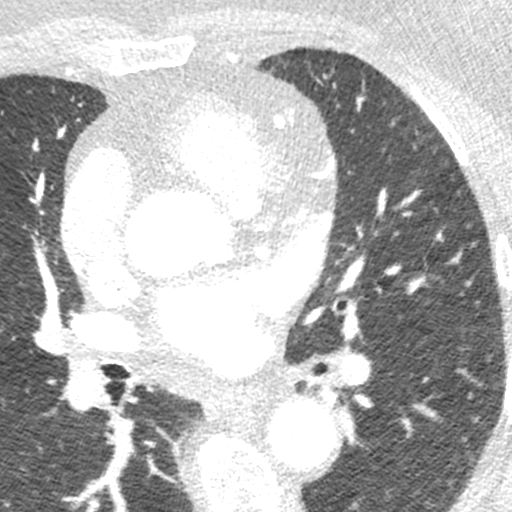

[Series 9: ts syst sharp 42 % · axial · 0.38mm/px · z∈[+1029,+1065]mm · 2 of 271 slices shown]
[im 91/271  lung]
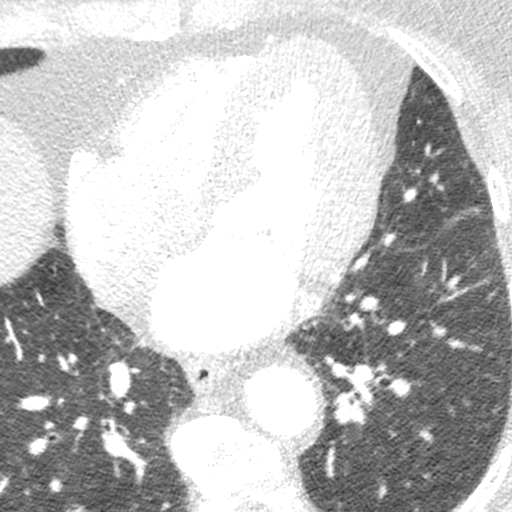
[im 181/271  lung]
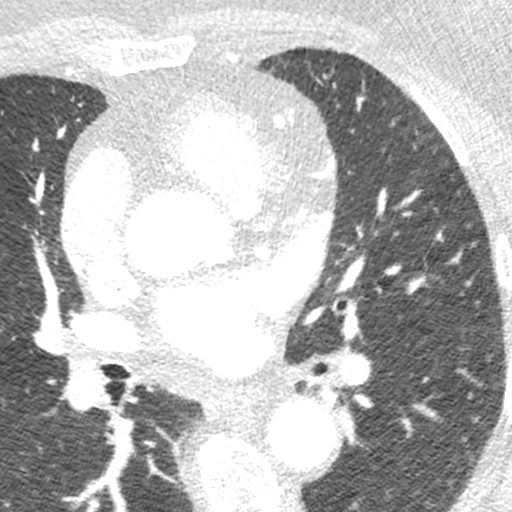

[8 of 20 positions shown; findings below may reference images not displayed]

FINDINGS: Vascular: Heart is normal size.  Aorta is normal caliber.

Mediastinum/Nodes: No adenopathy in the lower mediastinum or hila.

Lungs/Pleura: Small calcified granulomas in the posterior right
lower lobe. No confluent opacities or effusions.

Upper Abdomen: Imaging into the upper abdomen shows no acute
findings.

Musculoskeletal: Chest wall soft tissues are unremarkable. No acute
bony abnormality.
IMPRESSION: No acute or significant extracardiac abnormality.
FINDINGS: A 100 kV prospective scan was triggered in the descending thoracic
aorta at 111 HU's. Axial non-contrast 3 mm slices were carried out
through the heart. The data set was analyzed on a dedicated work
station and scored using the Agatson method. Gantry rotation speed
was 250 msecs and collimation was .6 mm. Low dose metoprolol 12.5mg
beta blockade and 0.8 mg of sl NTG was given. The 3D data set was
reconstructed in 5% intervals of the 67-82 % of the R-R cycle.
Diastolic phases were analyzed on a dedicated work station using
MPR, MIP and VRT modes. The patient received 80 cc of contrast.

Aorta:  Normal size.  Mild aortic atherosclerosis.  No dissection.

Aortic Valve: Trileaflet. Mild basal leaflet calcifications. Annular
mild calcification adjacent to LM ostium.

Coronary Arteries:  Normal coronary origin.  Left dominance.

RCA is a small non-dominant artery.  There is no plaque.

Left main is a large artery that gives rise to LAD and LCX arteries.

LAD is a large vessel that has no plaque. There are mild luminal
irregularities. 3 fairly small diagonal branches.

LCX is a dominant artery that gives rise to one large OM1 branch and
small PDA. There is no plaque.

Other findings:

Normal pulmonary vein drainage into the left atrium.

Normal left atrial appendage without a thrombus.

Normal size of the pulmonary artery.
IMPRESSION: 1. Coronary calcium score of 0. This was 0 percentile for age and
sex matched control.

2. Normal coronary origin with LEFT dominance (small PDA).

3. No evidence of flow limiting CAD.

4. Aortic atherosclerosis.

*** End of Addendum ***
EXAM:
OVER-READ INTERPRETATION  CT CHEST

The following report is an over-read performed by radiologist Dr.
Fina Fain [REDACTED] on 08/10/2019. This over-read
does not include interpretation of cardiac or coronary anatomy or
pathology. The coronary CTA interpretation by the cardiologist is
attached.
FINDINGS: Vascular: Heart is normal size.  Aorta is normal caliber.

Mediastinum/Nodes: No adenopathy in the lower mediastinum or hila.

Lungs/Pleura: Small calcified granulomas in the posterior right
lower lobe. No confluent opacities or effusions.

Upper Abdomen: Imaging into the upper abdomen shows no acute
findings.

Musculoskeletal: Chest wall soft tissues are unremarkable. No acute
bony abnormality.
IMPRESSION: No acute or significant extracardiac abnormality.

## 2021-05-27 DIAGNOSIS — S060X0A Concussion without loss of consciousness, initial encounter: Secondary | ICD-10-CM | POA: Insufficient documentation

## 2022-01-16 ENCOUNTER — Other Ambulatory Visit: Payer: Self-pay | Admitting: Rehabilitation

## 2022-01-16 DIAGNOSIS — M48062 Spinal stenosis, lumbar region with neurogenic claudication: Secondary | ICD-10-CM

## 2022-01-22 DIAGNOSIS — N182 Chronic kidney disease, stage 2 (mild): Secondary | ICD-10-CM | POA: Insufficient documentation

## 2022-01-26 ENCOUNTER — Ambulatory Visit
Admission: RE | Admit: 2022-01-26 | Discharge: 2022-01-26 | Disposition: A | Discharge status: Home / Self Care | Payer: Medicare HMO | Source: Ambulatory Visit | Attending: Rehabilitation | Admitting: Rehabilitation

## 2022-01-26 DIAGNOSIS — M48062 Spinal stenosis, lumbar region with neurogenic claudication: Secondary | ICD-10-CM

## 2022-02-20 ENCOUNTER — Other Ambulatory Visit: Payer: Self-pay | Admitting: Cardiology

## 2023-05-18 DIAGNOSIS — L739 Follicular disorder, unspecified: Secondary | ICD-10-CM | POA: Insufficient documentation

## 2023-05-18 DIAGNOSIS — Z0000 Encounter for general adult medical examination without abnormal findings: Secondary | ICD-10-CM | POA: Insufficient documentation

## 2023-05-18 DIAGNOSIS — M25542 Pain in joints of left hand: Secondary | ICD-10-CM | POA: Insufficient documentation

## 2023-05-20 ENCOUNTER — Encounter: Payer: Self-pay | Admitting: Cardiology

## 2023-06-18 ENCOUNTER — Ambulatory Visit: Payer: Medicare HMO | Attending: Cardiology | Admitting: Cardiology

## 2023-06-18 ENCOUNTER — Encounter: Payer: Self-pay | Admitting: Cardiology

## 2023-06-18 VITALS — BP 126/68 | HR 76 | Ht 64.5 in | Wt 243.8 lb

## 2023-06-18 DIAGNOSIS — I10 Essential (primary) hypertension: Secondary | ICD-10-CM | POA: Diagnosis not present

## 2023-06-18 DIAGNOSIS — R0609 Other forms of dyspnea: Secondary | ICD-10-CM

## 2023-06-18 NOTE — Patient Instructions (Signed)
 Medication Instructions:  Your physician recommends that you continue on your current medications as directed. Please refer to the Current Medication list given to you today.  *If you need a refill on your cardiac medications before your next appointment, please call your pharmacy*   Lab Work: None Ordered If you have labs (blood work) drawn today and your tests are completely normal, you will receive your results only by: MyChart Message (if you have MyChart) OR A paper copy in the mail If you have any lab test that is abnormal or we need to change your treatment, we will call you to review the results.   Testing/Procedures: Your physician has requested that you have an echocardiogram. Echocardiography is a painless test that uses sound waves to create images of your heart. It provides your doctor with information about the size and shape of your heart and how well your heart's chambers and valves are working. This procedure takes approximately one hour. There are no restrictions for this procedure. Please do NOT wear cologne, perfume, aftershave, or lotions (deodorant is allowed). Please arrive 15 minutes prior to your appointment time.  Please note: We ask at that you not bring children with you during ultrasound (echo/ vascular) testing. Due to room size and safety concerns, children are not allowed in the ultrasound rooms during exams. Our front office staff cannot provide observation of children in our lobby area while testing is being conducted. An adult accompanying a patient to their appointment will only be allowed in the ultrasound room at the discretion of the ultrasound technician under special circumstances. We apologize for any inconvenience.    Follow-Up: At [facility], you and your health needs are our priority.  As part of our continuing mission to provide you with exceptional heart care, we have created designated Provider Care Teams.  These Care Teams include your  primary Cardiologist (physician) and Advanced Practice Providers (APPs -  Physician Assistants and Nurse Practitioners) who all work together to provide you with the care you need, when you need it.  We recommend signing up for the patient portal called "MyChart".  Sign up information is provided on this After Visit Summary.  MyChart is used to connect with patients for Virtual Visits (Telemedicine).  Patients are able to view lab/test results, encounter notes, upcoming appointments, etc.  Non-urgent messages can be sent to your provider as well.   To learn more about what you can do with MyChart, go to [email or web address].    Your next appointment:   3 month(s)  The format for your next appointment:   In Person  Provider:   Gypsy Balsam, MD    Other Instructions NA

## 2023-06-18 NOTE — Progress Notes (Signed)
Cardiology Office Note:    Date:  06/18/2023   ID:  Tonya Sanchez, DOB 02/13/1951, MRN [id number]  PCP:  Raynelle Jan., MD  Cardiologist:  Gypsy Balsam, MD    Referring MD: Raynelle Jan., MD   Chief Complaint  Patient presents with   Chest Pain   Shortness of Breath         History of Present Illness:    Tonya Sanchez is a 72 y.o. female with past medical history significant for essential hypertension, family history of premature coronary artery disease, diabetes, dyslipidemia.  In 2021 2022 she had quite extensive ablation done that included coronary CT angio which showed calcium score 0, normal coronary without any evidence of coronary artery disease, at the same time she got to right side cardiac catheterization to look for any cardiac reason for her shortness of breath.  Pulmonary artery wedge pressure was normal cardiac output was normal.  She was referred back to [place] because of pain that she experienced.  She does have multiple back issues including to surgery of the cervical spine.  She complained of having pain tightness in the back all the time she wakes up with this she goes to sleep with that sometimes when she does certain motion she will have stabbing shocking pain going from the back towards the front of her chest that lasts only for split-second.  She does not have any exertional chest pain.  There is no typical tightness squeezing pressure burning chest.  She is trying to be elevated more active but have difficulty because of back problem.  She did see back doctor who told her this is unlikely be related to her back at the same time she said that when she had neck surgery done they said they fixed 2 levels of her spine 1 was getting better as well as she thinks maybe this is what caused the problem.  Described to have some fatigue tiredness shortness of breath continues to have palpitations but not anymore  Past Medical History:  Diagnosis Date   Achilles  tendonitis, bilateral 02/04/2019   Acute medial meniscus tear of right knee 09/23/2019   Formatting of this note might be different from the original. Added automatically from request for surgery [id number]   Anxiety    occasional - no current med.   Arthritis    "everywhere"   Bladder tumor 05/03/2018   Cervical spondylosis 04/02/2018   Cervical spondylosis with radiculopathy 12/23/2017   Cervical stenosis of spinal canal 12/23/2017   Added automatically from request for surgery [id number]   Chronic pain syndrome 05/03/2018   Tommi Rumps Quervain's disease (tenosynovitis) 10/13/2017   Diabetic peripheral neuropathy (HCC) 03/18/2019   Essential hypertension 12/28/2017   Fibromyalgia 05/03/2018   Gastroesophageal reflux disease 05/06/2018   Hand arthritis 01/08/2017   History of DVT (deep vein thrombosis) 1960s   no problems since   History of recurrent UTIs 05/03/2018   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 x 3-4 yr.   Leg swelling 02/04/2019   Lumbar facet arthropathy 08/19/2017   Lumbar radiculopathy 03/18/2019   Mass of hand 10/2012   left   Metatarsal stress fracture, left, initial encounter 11/01/2014   Moderate episode of recurrent major depressive disorder (HCC) 05/03/2018   Morbid obesity (HCC) 02/04/2019   NSVT (nonsustained ventricular tachycardia) (HCC) 08/05/2019   Obesity (BMI 30.0-34.9) 05/03/2018   Other hyperlipidemia 05/03/2018  Pain, joint, ankle and foot, unspecified laterality 11/01/2014   PAT (paroxysmal atrial tachycardia) (HCC) 08/05/2019   Primary localized osteoarthrosis, ankle and foot 02/04/2019   Primary osteoarthritis of first carpometacarpal joint of left hand 07/08/2019   Formatting of this note might be different from the original. Added automatically from request for surgery [id number]   S/P cervical spinal fusion 04/02/2018   Stenosing tenosynovitis of wrist 10/2012   left   TIA (transient  ischemic attack) 03/2010   no deficits   Tremor, essential 10/21/2017   Type 2 diabetes mellitus (HCC) 12/28/2017   Wrist tendonitis 10/13/2017    Past Surgical History:  Procedure Laterality Date   ABDOMINAL HYSTERECTOMY     partial   ANTERIOR AND POSTERIOR VAGINAL REPAIR  04/07/2000   with vaginal vault suspension   ANTERIOR CERVICAL DECOMP/DISCECTOMY FUSION  10/20/2000   C5-6   CARPAL TUNNEL RELEASE Right 04/12/2003   CARPAL TUNNEL RELEASE Left 05/25/2003   CARPAL TUNNEL RELEASE Left 10/27/2012   Procedure: LEFT WRIST STENOSISNG TENOSYNOVITIS RELEASE;  Surgeon: Marlowe Shores, MD;  Location: [facility];  Service: Orthopedics;  Laterality: Left;   CHOLECYSTECTOMY     CLOSED MANIPULATION KNEE WITH STERIOD INJECTION Left 01/27/2000   CYSTOSCOPY WITH BIOPSY  04/01/2011   bladder bx. x 4   EXAMINATION UNDER ANESTHESIA Left 01/27/2000   ankle - with steroid injection   FOOT ARTHRODESIS, TRIPLE Bilateral    FOOT GANGLION EXCISION Left 02/10/2002   FOOT SURGERY Bilateral    spurs removed from both little toes   KNEE ARTHROSCOPY Right    PUBOVAGINAL SLING  04/07/2000   RIGHT HEART CATH N/A 11/02/2020   Procedure: RIGHT HEART CATH;  Surgeon: Lyn Records, MD;  Location: [facility] INVASIVE CV LAB;  Service: Cardiovascular;  Laterality: N/A;   TENDON RECONSTRUCTION Left    foot   TOTAL KNEE ARTHROPLASTY Left 09/09/1999    Current Medications: Current Meds  Medication Sig   atorvastatin (LIPITOR) 20 MG tablet Take 1 tablet by mouth daily.   Cholecalciferol 125 MCG (5000 UT) TABS Take 5,000 Units by mouth daily.   Dulaglutide 4.5 MG/0.5ML SOPN Inject 4.5 mg into the muscle every Thursday.   empagliflozin (JARDIANCE) 25 MG TABS tablet Take 1 tablet by mouth daily.   esomeprazole (NEXIUM) 40 MG capsule Take 40 mg by mouth daily before breakfast.   furosemide (LASIX) 40 MG tablet Take 40 mg by mouth daily.   glipiZIDE (GLUCOTROL XL) 10 MG 24 hr tablet Take 1 tablet by mouth daily.    meloxicam (MOBIC) 15 MG tablet Take 15 mg by mouth daily.   olmesartan (BENICAR) 40 MG tablet Take 40 mg by mouth daily.   venlafaxine XR (EFFEXOR-XR) 75 MG 24 hr capsule Take 75 mg by mouth daily with breakfast.   [DISCONTINUED] pregabalin (LYRICA) 50 MG capsule Take 50 mg by mouth daily.     Allergies:   Gabapentin, Hydrocodone, Keflex [cephalexin], Metformin hcl, and Percocet [oxycodone-acetaminophen]   Social History   Socioeconomic History   Marital status: Married    Spouse name: Not on file   Number of children: Not on file   Years of education: Not on file   Highest education level: Not on file  Occupational History   Not on file  Tobacco Use   Smoking status: Never   Smokeless tobacco: Never  Substance and Sexual Activity   Alcohol use: No   Drug use: No   Sexual activity: Not on file  Other Topics Concern  Not on file  Social History Narrative   Not on file   Social Drivers of Health   Financial Resource Strain: Not on file  Food Insecurity: Low Risk  (02/27/2023)   Received from [facility]   Hunger Vital Sign    Worried About Running Out of Food in the Last Year: Never true    Ran Out of Food in the Last Year: Never true  Transportation Needs: No Transportation Needs (02/27/2023)   Received from [employer]    In the past 12 months, has lack of reliable transportation kept you from medical appointments, meetings, work or from getting things needed for daily living? : No  Physical Activity: Not on file  Stress: Not on file  Social Connections: Not on file     Family History: The patient's family history includes Breast cancer in her mother; COPD in her father; Diabetes in her mother; Emphysema in her brother; Heart disease in her brother and father; Sjogren's syndrome in her sister; Stroke in her brother, father, and sister. ROS:   Please see the history of present illness.    All 14 point review of systems negative except as  described per history of present illness  EKGs/Labs/Other Studies Reviewed:    EKG Interpretation Date/Time:  Thursday June 18 2023 10:52:17 EST Ventricular Rate:  76 PR Interval:  152 QRS Duration:  78 QT Interval:  372 QTC Calculation: 418 R Axis:   0  Text Interpretation: Normal sinus rhythm Low voltage QRS Borderline ECG When compared with ECG of 26-Oct-2012 14:12, No significant change was found Confirmed by Gypsy Balsam [phone]) on 06/18/2023 11:03:14 AM    Recent Labs: No results found for requested labs within last 365 days.  Recent Lipid Panel    Component Value Date/Time   CHOL 109 08/01/2019 1531   TRIG 135 08/01/2019 1531   HDL 38 (L) 08/01/2019 1531   CHOLHDL 2.9 08/01/2019 1531   CHOLHDL 3.8 04/01/2010 0910   VLDL 30 04/01/2010 0910   LDLCALC 47 08/01/2019 1531    Physical Exam:    VS:  BP 126/68 (BP Location: Left Arm, Patient Position: Sitting)   Pulse 76   Ht 5' 4.5" (1.638 m)   Wt 243 lb 12.8 oz (110.6 kg)   SpO2 97%   BMI 41.20 kg/m     Wt Readings from Last 3 Encounters:  06/18/23 243 lb 12.8 oz (110.6 kg)  05/18/23 240 lb (108.9 kg)  11/22/20 262 lb 9.6 oz (119.1 kg)     GEN:  Well nourished, well developed in no acute distress HEENT: Normal NECK: No JVD; No carotid bruits LYMPHATICS: No lymphadenopathy CARDIAC: RRR, no murmurs, no rubs, no gallops RESPIRATORY:  Clear to auscultation without rales, wheezing or rhonchi  ABDOMEN: Soft, non-tender, non-distended MUSCULOSKELETAL:  No edema; No deformity  SKIN: Warm and dry LOWER EXTREMITIES: no swelling NEUROLOGIC:  Alert and oriented x 3 PSYCHIATRIC:  Normal affect   ASSESSMENT:    1. Essential hypertension   2. Dyspnea on exertion    PLAN:    In order of problems listed above:  Chest pain little very atypical.  Lasting the all the time and then she gets sharp stabbing pain going from her back towards the front of her chest I suspect chronic back problem may play some role  here.  She may benefit from having CT of the chest and abdomen to make sure there is no additional pathology that could be responsible for her  symptomatology I do not think this is cardiac pain does not have any characteristic of angina.  On top of that few years ago she got evaluation done for coronary artery disease that she got a calcium score 0 and normal coronaries which make it again less likely.  Still possible I think she can benefit from seeing GI doctor she may have to have stomach investigated pancreas she may benefit from CT of her abdomen and chest make sure we do not miss anything there I will see her back again in 3 months and if that situation changed we may be forced to evaluate again for coronary artery disease. Dyspnea on exertion I will schedule to have echocardiogram done. Essential hypertension blood pressure well-controlled continue present management. Dyslipidemia I did review K PN that show me her LDL of 70 HDL 43.  Will continue present management   Medication Adjustments/Labs and Tests Ordered: Current medicines are reviewed at length with the patient today.  Concerns regarding medicines are outlined above.  Orders Placed This Encounter  Procedures   EKG 12-Lead   ECHOCARDIOGRAM COMPLETE   Medication changes: No orders of the defined types were placed in this encounter.   Signed, Georgeanna Lea, MD, [facility] 06/18/2023 11:46 AM    [facility]

## 2023-07-08 ENCOUNTER — Ambulatory Visit: Payer: Medicare HMO | Attending: Cardiology

## 2023-07-08 DIAGNOSIS — R0609 Other forms of dyspnea: Secondary | ICD-10-CM | POA: Diagnosis not present

## 2023-07-08 LAB — ECHOCARDIOGRAM COMPLETE
Area-P 1/2: 3.72 cm2
S' Lateral: 2.4 cm

## 2023-07-08 MED ORDER — PERFLUTREN LIPID MICROSPHERE
1.0000 mL | INTRAVENOUS | Status: AC | PRN
  Administered 2023-07-08: 5 mL via INTRAVENOUS

## 2023-07-17 ENCOUNTER — Telehealth: Payer: Self-pay

## 2023-07-17 NOTE — Telephone Encounter (Signed)
-----   Message from Gypsy Balsam sent at 07/09/2023 11:57 AM EST ----- Echocardiogram showed preserved left ventricular ejection fraction with moderate left ventricle hypertrophy which is thickening of the wall of the heart but overall otherwise looks good

## 2023-07-17 NOTE — Telephone Encounter (Signed)
Patient notified of results and verbalized understanding.

## 2023-09-17 ENCOUNTER — Ambulatory Visit: Payer: Medicare HMO | Admitting: Cardiology

## 2023-10-08 ENCOUNTER — Encounter: Payer: Self-pay | Admitting: Cardiology

## 2023-10-08 ENCOUNTER — Ambulatory Visit: Attending: Cardiology | Admitting: Cardiology

## 2023-10-08 ENCOUNTER — Ambulatory Visit: Attending: Cardiology

## 2023-10-08 VITALS — BP 132/68 | HR 87 | Ht 64.0 in | Wt 243.0 lb

## 2023-10-08 DIAGNOSIS — R002 Palpitations: Secondary | ICD-10-CM | POA: Diagnosis not present

## 2023-10-08 DIAGNOSIS — I4719 Other supraventricular tachycardia: Secondary | ICD-10-CM

## 2023-10-08 DIAGNOSIS — E7849 Other hyperlipidemia: Secondary | ICD-10-CM | POA: Diagnosis not present

## 2023-10-08 DIAGNOSIS — I10 Essential (primary) hypertension: Secondary | ICD-10-CM

## 2023-10-08 NOTE — Addendum Note (Signed)
 Addended by: Shawnee Dellen D on: 10/08/2023 09:58 AM   Modules accepted: Orders

## 2023-10-08 NOTE — Progress Notes (Signed)
 Cardiology Office Note:    Date:  10/08/2023   ID:  Tonya Sanchez, DOB 10-13-1950, MRN [id number]  PCP:  Raynelle Jan., MD  Cardiologist:  Gypsy Balsam, MD    Referring MD: Raynelle Jan., MD   Chief Complaint  Patient presents with   Follow-up    History of Present Illness:    Tonya Sanchez is a 73 y.o. female past medical history significant for atypical chest pain, diabetes, essential hypertension, dyslipidemia she was referred to [place] for atypical symptoms.  Within the last few years she got a calcium score done which was 0, coronary CT angio has been performed as well which showed normal coronaries.  His pains were very atypical and we did not pursue an ischemic workup.  We did however do echocardiogram which showed preserved/normal left ventricle ejection fraction. She comes today to months for follow-up overall her pains went away.  She is very happy with this however she described to have some palpitations which she mean by that she will feel her heart speeding up and going fast for sometimes couple minutes make her feel little bit lightheaded but not to the point of passing out this may be LV shortness of breath but no chest pain associated with this sensation interestingly she tells me that she had it all her life however the recent episode she had make her very worried.  She is trying to be active but admits that she does not do enough.  Past Medical History:  Diagnosis Date   Achilles tendonitis, bilateral 02/04/2019   Acute medial meniscus tear of right knee 09/23/2019   Formatting of this note might be different from the original. Added automatically from request for surgery [id number]   Anxiety    occasional - no current med.   Arthritis    "everywhere"   Bladder tumor 05/03/2018   Cervical spondylosis 04/02/2018   Cervical spondylosis with radiculopathy 12/23/2017   Cervical stenosis of spinal canal 12/23/2017   Added automatically from request for surgery [id number]    Chronic pain syndrome 05/03/2018   Tommi Rumps Quervain's disease (tenosynovitis) 10/13/2017   Diabetic peripheral neuropathy (HCC) 03/18/2019   Essential hypertension 12/28/2017   Fibromyalgia 05/03/2018   Gastroesophageal reflux disease 05/06/2018   Hand arthritis 01/08/2017   History of DVT (deep vein thrombosis) 1960s   no problems since   History of recurrent UTIs 05/03/2018   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 x 3-4 yr.   Leg swelling 02/04/2019   Lumbar facet arthropathy 08/19/2017   Lumbar radiculopathy 03/18/2019   Mass of hand 10/2012   left   Metatarsal stress fracture, left, initial encounter 11/01/2014   Moderate episode of recurrent major depressive disorder (HCC) 05/03/2018   Morbid obesity (HCC) 02/04/2019   NSVT (nonsustained ventricular tachycardia) (HCC) 08/05/2019   Obesity (BMI 30.0-34.9) 05/03/2018   Other hyperlipidemia 05/03/2018   Pain, joint, ankle and foot, unspecified laterality 11/01/2014   PAT (paroxysmal atrial tachycardia) (HCC) 08/05/2019   Primary localized osteoarthrosis, ankle and foot 02/04/2019   Primary osteoarthritis of first carpometacarpal joint of left hand 07/08/2019   Formatting of this note might be different from the original. Added automatically from request for surgery [id number]   S/P cervical spinal fusion 04/02/2018   Stenosing tenosynovitis of wrist 10/2012   left   TIA (transient ischemic attack) 03/2010   no deficits   Tremor, essential 10/21/2017  Type 2 diabetes mellitus (HCC) 12/28/2017   Wrist tendonitis 10/13/2017    Past Surgical History:  Procedure Laterality Date   ABDOMINAL HYSTERECTOMY     partial   ANTERIOR AND POSTERIOR VAGINAL REPAIR  04/07/2000   with vaginal vault suspension   ANTERIOR CERVICAL DECOMP/DISCECTOMY FUSION  10/20/2000   C5-6   CARPAL TUNNEL RELEASE Right 04/12/2003   CARPAL TUNNEL RELEASE Left 05/25/2003   CARPAL TUNNEL  RELEASE Left 10/27/2012   Procedure: LEFT WRIST STENOSISNG TENOSYNOVITIS RELEASE;  Surgeon: Marlowe Shores, MD;  Location: [facility];  Service: Orthopedics;  Laterality: Left;   CHOLECYSTECTOMY     CLOSED MANIPULATION KNEE WITH STERIOD INJECTION Left 01/27/2000   CYSTOSCOPY WITH BIOPSY  04/01/2011   bladder bx. x 4   EXAMINATION UNDER ANESTHESIA Left 01/27/2000   ankle - with steroid injection   FOOT ARTHRODESIS, TRIPLE Bilateral    FOOT GANGLION EXCISION Left 02/10/2002   FOOT SURGERY Bilateral    spurs removed from both little toes   KNEE ARTHROSCOPY Right    PUBOVAGINAL SLING  04/07/2000   RIGHT HEART CATH N/A 11/02/2020   Procedure: RIGHT HEART CATH;  Surgeon: Lyn Records, MD;  Location: [facility] INVASIVE CV LAB;  Service: Cardiovascular;  Laterality: N/A;   TENDON RECONSTRUCTION Left    foot   TOTAL KNEE ARTHROPLASTY Left 09/09/1999    Current Medications: Current Meds  Medication Sig   atorvastatin (LIPITOR) 20 MG tablet Take 1 tablet by mouth daily.   Cholecalciferol 125 MCG (5000 UT) TABS Take 5,000 Units by mouth daily.   Dulaglutide 4.5 MG/0.5ML SOPN Inject 4.5 mg into the muscle every Thursday.   DULoxetine (CYMBALTA) 20 MG capsule Take 1 capsule by mouth daily.   empagliflozin (JARDIANCE) 25 MG TABS tablet Take 1 tablet by mouth daily.   esomeprazole (NEXIUM) 40 MG capsule Take 40 mg by mouth daily before breakfast.   furosemide (LASIX) 40 MG tablet Take 40 mg by mouth daily.   glipiZIDE (GLUCOTROL XL) 10 MG 24 hr tablet Take 1 tablet by mouth daily.   meloxicam (MOBIC) 15 MG tablet Take 15 mg by mouth daily.   olmesartan (BENICAR) 40 MG tablet Take 40 mg by mouth daily.   venlafaxine XR (EFFEXOR-XR) 75 MG 24 hr capsule Take 75 mg by mouth daily with breakfast.     Allergies:   Gabapentin, Hydrocodone, Keflex [cephalexin], Metformin hcl, and Percocet [oxycodone-acetaminophen]   Social History   Socioeconomic History   Marital status: Married    Spouse  name: Not on file   Number of children: Not on file   Years of education: Not on file   Highest education level: Not on file  Occupational History   Not on file  Tobacco Use   Smoking status: Never   Smokeless tobacco: Never  Substance and Sexual Activity   Alcohol use: No   Drug use: No   Sexual activity: Not on file  Other Topics Concern   Not on file  Social History Narrative   Not on file   Social Drivers of Health   Financial Resource Strain: Not on file  Food Insecurity: Low Risk  (02/27/2023)   Received from [facility]   Hunger Vital Sign    Worried About Running Out of Food in the Last Year: Never true    Ran Out of Food in the Last Year: Never true  Transportation Needs: No Transportation Needs (02/27/2023)   Received from [employer]  In the past 12 months, has lack of reliable transportation kept you from medical appointments, meetings, work or from getting things needed for daily living? : No  Physical Activity: Not on file  Stress: Not on file  Social Connections: Not on file     Family History: The patient's family history includes Breast cancer in her mother; COPD in her father; Diabetes in her mother; Emphysema in her brother; Heart disease in her brother and father; Sjogren's syndrome in her sister; Stroke in her brother, father, and sister. ROS:   Please see the history of present illness.    All 14 point review of systems negative except as described per history of present illness  EKGs/Labs/Other Studies Reviewed:         Recent Labs: No results found for requested labs within last 365 days.  Recent Lipid Panel    Component Value Date/Time   CHOL 109 08/01/2019 1531   TRIG 135 08/01/2019 1531   HDL 38 (L) 08/01/2019 1531   CHOLHDL 2.9 08/01/2019 1531   CHOLHDL 3.8 04/01/2010 0910   VLDL 30 04/01/2010 0910   LDLCALC 47 08/01/2019 1531    Physical Exam:    VS:  BP 132/68 (BP Location: Right Arm, Patient Position:  Sitting)   Pulse 87   Ht 5\' 4"  (1.626 m)   Wt 243 lb (110.2 kg)   SpO2 98%   BMI 41.71 kg/m     Wt Readings from Last 3 Encounters:  10/08/23 243 lb (110.2 kg)  06/18/23 243 lb 12.8 oz (110.6 kg)  05/18/23 240 lb (108.9 kg)     GEN:  Well nourished, well developed in no acute distress HEENT: Normal NECK: No JVD; No carotid bruits LYMPHATICS: No lymphadenopathy CARDIAC: RRR, no murmurs, no rubs, no gallops RESPIRATORY:  Clear to auscultation without rales, wheezing or rhonchi  ABDOMEN: Soft, non-tender, non-distended MUSCULOSKELETAL:  No edema; No deformity  SKIN: Warm and dry LOWER EXTREMITIES: no swelling NEUROLOGIC:  Alert and oriented x 3 PSYCHIATRIC:  Normal affect   ASSESSMENT:    1. Essential hypertension   2. PAT (paroxysmal atrial tachycardia) (HCC)   3. Other hyperlipidemia    PLAN:    In order of problems listed above:  Chest pain gone we can drop the issue, evaluation done in 2021 included coronary CT angio calcium score both were normal. Essential hypertension blood pressure well-controlled continue present management. Palpitations, will do Zio patch to see if she get any significant arrhythmia of course concerns about potentially atrial fibrillation if that is the case anticoagulation will be started. Hyperlipidemia, follow-up by internal medicine team.  I do not have any recent results of it.   Medication Adjustments/Labs and Tests Ordered: Current medicines are reviewed at length with the patient today.  Concerns regarding medicines are outlined above.  No orders of the defined types were placed in this encounter.  Medication changes: No orders of the defined types were placed in this encounter.   Signed, Manfred Seed, MD, [facility] 10/08/2023 9:48 AM    [facility]

## 2023-10-08 NOTE — Patient Instructions (Signed)
Medication Instructions:  Your physician recommends that you continue on your current medications as directed. Please refer to the Current Medication list given to you today.  *If you need a refill on your cardiac medications before your next appointment, please call your pharmacy*   Lab Work: None Ordered If you have labs (blood work) drawn today and your tests are completely normal, you will receive your results only by: MyChart Message (if you have MyChart) OR A paper copy in the mail If you have any lab test that is abnormal or we need to change your treatment, we will call you to review the results.   Testing/Procedures:  WHY IS MY DOCTOR PRESCRIBING ZIO? The Zio system is proven and trusted by physicians to detect and diagnose irregular heart rhythms -- and has been prescribed to hundreds of thousands of patients.  The FDA has cleared the Zio system to monitor for many different kinds of irregular heart rhythms. In a study, physicians were able to reach a diagnosis 90% of the time with the Zio system1.  You can wear the Zio monitor -- a small, discreet, comfortable patch -- during your normal day-to-day activity, including while you sleep, shower, and exercise, while it records every single heartbeat for analysis.  1Barrett, P., et al. Comparison of 24 Hour Holter Monitoring Versus 14 Day Novel Adhesive Patch Electrocardiographic Monitoring. American Journal of Medicine, 2014.  ZIO VS. HOLTER MONITORING The Zio monitor can be comfortably worn for up to 14 days. Holter monitors can be worn for 24 to 48 hours, limiting the time to record any irregular heart rhythms you may have. Zio is able to capture data for the 51% of patients who have their first symptom-triggered arrhythmia after 48 hours.1  LIVE WITHOUT RESTRICTIONS The Zio ambulatory cardiac monitor is a small, unobtrusive, and water-resistant patch--you might even forget you're wearing it. The Zio monitor records and stores  every beat of your heart, whether you're sleeping, working out, or showering.     Follow-Up: At [facility], you and your health needs are our priority.  As part of our continuing mission to provide you with exceptional heart care, we have created designated Provider Care Teams.  These Care Teams include your primary Cardiologist (physician) and Advanced Practice Providers (APPs -  Physician Assistants and Nurse Practitioners) who all work together to provide you with the care you need, when you need it.  We recommend signing up for the patient portal called "MyChart".  Sign up information is provided on this After Visit Summary.  MyChart is used to connect with patients for Virtual Visits (Telemedicine).  Patients are able to view lab/test results, encounter notes, upcoming appointments, etc.  Non-urgent messages can be sent to your provider as well.   To learn more about what you can do with MyChart, go to [email or web address].    Your next appointment:   6 month(s)  The format for your next appointment:   In Person  Provider:   Robert Krasowski, MD    Other Instructions NA  

## 2023-11-02 DIAGNOSIS — R002 Palpitations: Secondary | ICD-10-CM

## 2023-11-11 ENCOUNTER — Ambulatory Visit: Payer: Self-pay | Admitting: Cardiology

## 2023-11-12 ENCOUNTER — Other Ambulatory Visit: Payer: Self-pay

## 2023-11-12 NOTE — Telephone Encounter (Signed)
 Spoke with pt regarding monitor results. She stated that she would prefer to talk with Dr. Krasowski at her next appt before starting any new medications.
